# Patient Record
Sex: Male | Born: 1950 | Race: White | Hispanic: No | Marital: Married | Smoking: Former smoker
Health system: Southern US, Community
[De-identification: ages and names within clinical notes are randomized; demographics above are authoritative.]

## PROBLEM LIST (undated history)

## (undated) DIAGNOSIS — L03119 Cellulitis of unspecified part of limb: Secondary | ICD-10-CM

## (undated) DIAGNOSIS — R011 Cardiac murmur, unspecified: Secondary | ICD-10-CM

## (undated) DIAGNOSIS — J189 Pneumonia, unspecified organism: Secondary | ICD-10-CM

## (undated) DIAGNOSIS — L02619 Cutaneous abscess of unspecified foot: Secondary | ICD-10-CM

## (undated) DIAGNOSIS — I739 Peripheral vascular disease, unspecified: Secondary | ICD-10-CM

## (undated) HISTORY — DX: Cutaneous abscess of unspecified foot: L03.119

## (undated) HISTORY — DX: Cellulitis of unspecified part of limb: L02.619

## (undated) HISTORY — PX: ABDOMINAL SURGERY: SHX537

---

## 1973-09-20 HISTORY — PX: HERNIA REPAIR: SHX51

## 1995-09-21 HISTORY — PX: SPINE SURGERY: SHX786

## 1998-01-14 ENCOUNTER — Emergency Department (HOSPITAL_COMMUNITY): Admission: EM | Admit: 1998-01-14 | Discharge: 1998-01-14 | Payer: Self-pay | Admitting: Emergency Medicine

## 2002-02-22 ENCOUNTER — Encounter: Payer: Self-pay | Admitting: Family Medicine

## 2002-02-22 ENCOUNTER — Encounter: Admission: RE | Admit: 2002-02-22 | Discharge: 2002-02-22 | Payer: Self-pay | Admitting: Family Medicine

## 2002-04-13 ENCOUNTER — Encounter: Payer: Self-pay | Admitting: Neurosurgery

## 2002-04-19 ENCOUNTER — Ambulatory Visit (HOSPITAL_COMMUNITY): Admission: RE | Admit: 2002-04-19 | Discharge: 2002-04-19 | Payer: Self-pay | Admitting: Neurosurgery

## 2002-04-19 ENCOUNTER — Encounter: Payer: Self-pay | Admitting: Neurosurgery

## 2002-05-16 ENCOUNTER — Encounter: Payer: Self-pay | Admitting: Neurosurgery

## 2002-05-16 ENCOUNTER — Encounter: Admission: RE | Admit: 2002-05-16 | Discharge: 2002-05-16 | Payer: Self-pay | Admitting: Neurosurgery

## 2004-08-27 ENCOUNTER — Ambulatory Visit (HOSPITAL_COMMUNITY): Admission: RE | Admit: 2004-08-27 | Discharge: 2004-08-27 | Payer: Self-pay | Admitting: Gastroenterology

## 2004-08-27 ENCOUNTER — Encounter (INDEPENDENT_AMBULATORY_CARE_PROVIDER_SITE_OTHER): Payer: Self-pay | Admitting: *Deleted

## 2005-09-20 HISTORY — PX: CERVICAL SPINE SURGERY: SHX589

## 2007-12-22 ENCOUNTER — Inpatient Hospital Stay (HOSPITAL_COMMUNITY): Admission: EM | Admit: 2007-12-22 | Discharge: 2007-12-27 | Payer: Self-pay | Admitting: Emergency Medicine

## 2011-02-02 NOTE — Op Note (Signed)
Joshua Walter, Joshua Walter               ACCOUNT NO.:  [id number]   MEDICAL RECORD NO.:  [id number]          PATIENT TYPE:  INP   LOCATION:  5120                         FACILITY:  [facility]   PHYSICIAN:  Alfonse Ras, MD   DATE OF BIRTH:  1951/01/29   DATE OF PROCEDURE:  12/22/2007  DATE OF DISCHARGE:                               OPERATIVE REPORT   PREOPERATIVE DIAGNOSIS:  Small bowel obstruction.   POSTOPERATIVE DIAGNOSES:  Small bowel obstruction, omental adhesions  with closed loop obstruction.   PROCEDURE:  Exploratory laparotomy, lysis of adhesions.   SURGEON:  Alfonse Ras, MD   ASSISTANT:  Gabrielle Dare. [name], M.D.   ANESTHESIA:  General anesthesia endotracheal tube.   FINDINGS:  The patient had a closed loop obstruction in the mid jejunum  secondary to omental adhesions to the left lower quadrant.  There was no  evidence of diverticulitis.   DESCRIPTION:  The patient was taken to the operating room, placed in a  supine position.  After adequate general anesthesia was induced using  endotracheal tube, the abdomen was prepped and draped in normal sterile  fashion.  Using a vertical midline incision, I dissected down to the  fascia.  Fascia was opened vertically.  On entering the peritoneum, a  small amount of ascites was encountered.  The omentum which was quite  stuck to the left lower quadrant was mobilized using sharp dissection.  There was just a small  band there.  There was no evidence of  diverticulitis.  An area of very erythematous small bowel was identified  which was completely viable.  At this point, the small bowel was run  from the ligament of Treitz to the ileocecal valve.  There was  approximately a 30-cm section of inflamed, erythematous, edematous small  bowel consistent with a closed loop obstruction.  On careful palpation,  there was no evidence of tumor or other pathology.  Small reactive lymph  nodes were identified in the mesentery but nothing  suspicious for  neoplasm.  The colon from the cecum down to the rectum was also run and  other than some very small scattered diverticula, no evidence of  diverticulitis or tumor were noted.  The abdomen was irrigated and the  fascia was closed with a running #1 Novofil.  Skin was closed with  staples.  The patient tolerated the procedure well, went to PACU in good  condition.      Alfonse Ras, MD  Electronically Signed     KRE/MEDQ  D:  12/22/2007  T:  12/22/2007  Job:  [id number]

## 2011-02-02 NOTE — Consult Note (Signed)
Joshua Walter, Joshua Walter               ACCOUNT NO.:  [id number]   MEDICAL RECORD NO.:  [id number]          PATIENT TYPE:  EMS   LOCATION:  MAJO                         FACILITY:  [facility]   PHYSICIAN:  Clovis Pu. Cornett, M.D.DATE OF BIRTH:  [birth year]   DATE OF CONSULTATION:  12/22/2007  DATE OF DISCHARGE:                                 CONSULTATION   REQUESTING PHYSICIAN:  Pollyann Savoy, M.D.   REASON FOR CONSULTATION:  Abdominal pain.   HISTORY OF PRESENT ILLNESS:  Patient is a pleasant 60 year old male with  about a 12-hour history of periumbilical and diffuse abdominal pain.  He  noticed it today while taking a bowel movement.  The pain is sharp in  nature, diffuse, originating in the periumbilical region.  It is  constant in nature and sharp.  It worsened as the day went on, and that  is why he came to the emergency room for evaluation.  He has had no  associated nausea or vomiting currently.  His last bowel movement was  today earlier.  He denies any history of blood in his stool but had a  history of diarrhea last week.  He does have a history of colitis and  peptic ulcer disease.  I was asked to see him today at the request of  Dr. Bernette Mayers in consultation for the above.  The pain has lasted for the  last 12 hours or so.  It has a worsening course.   PAST MEDICAL HISTORY:  1. Peptic ulcer disease.  2. Colitis.   PAST SURGICAL HISTORY:  Left inguinal hernia repair.   FAMILY HISTORY:  Positive for questionable Crohn's disease and Hodgkin's  lymphoma.   SOCIAL HISTORY:  Denies tobacco or alcohol use.  He is married.   ALLERGIES:  No known drug allergies.   MEDICATIONS:  None currently.   REVIEW OF SYSTEMS:  A 15-point review of systems is negative other than  that stated above.   PHYSICAL EXAMINATION:  Temperature 98, pulse 89, blood pressure 153/90.  GENERAL APPEARANCE:  Pleasant male in no apparent distress.  HEENT:  Extraocular movements are intact.  Oropharynx  clear.  NECK:  Supple and nontender.  Trachea is midline.  CHEST:  Clear to auscultation.  Chest wall motion is normal.  CARDIOVASCULAR:  Regular rate and rhythm without murmur, rub or gallop.  Extremities are warm and well perfused.  ABDOMEN:  Distended with decreased bowel sounds.  It is tympanitic.  He  is mildly tender throughout without acute peritoneal signs currently.  EXTREMITIES:  Muscle tone is normal.  Range of motion normal.  NEUROLOGIC:  Glasgow Coma Scale is 15.  Motor and sensory function  grossly intact.   Diagnostic studies are reviewed of his abdomen and pelvic CT.  There  appears to be a small bowel obstruction.  There is a mesenteric pedicle,  but I do not see any swirling of vessels to indicate a torsion.  However, the radiologist does relate that as a possibility in any  report.  He has a minimal amount of ascites.  In the bowel, he does have  some inflamed-appearing small bowel.  No abscess or free air noted.   He has a white count of 20,800.  Hemoglobin 15.  Electrolytes are within  normal limits.  Urinalysis within normal limits.  Amylase and lipase are  normal.   IMPRESSION/PLAN:  1. Small bowel obstruction with unknown etiology with small bowel      swelling and inflammation.  He will be admitted for NG tube      placement, IV fluids resuscitation, antibiotics, and reassessment      in the morning.  We will recheck films in the morning to see if      contrast passes.  He may require laparoscopy to further evaluate      his abdominal complaints, but he has no acute surgical signs      currently and has no signs of peritonitis at this point.  I have      discussed with the patient, and he voices understanding and agrees      to proceed.  2. History of peptic ulcer disease.  3. History of colitis.  4. Questionable history of inflammatory bowel disease.      Thomas A. Cornett, M.D.  Electronically Signed     TAC/MEDQ  D:  12/22/2007  T:  12/22/2007   Job:  [id number]   cc:   Pollyann Savoy, MD

## 2011-02-05 NOTE — Op Note (Signed)
Joshua Walter, Joshua Walter               ACCOUNT NO.:  [id number]   MEDICAL RECORD NO.:  [id number]          PATIENT TYPE:  AMB   LOCATION:  ENDO                         FACILITY:  [facility]   PHYSICIAN:  Graylin Shiver, M.D.   DATE OF BIRTH:  01/22/1951   DATE OF PROCEDURE:  08/27/2004  DATE OF DISCHARGE:                                 OPERATIVE REPORT   PROCEDURE:  Esophagogastroduodenoscopy.   INDICATIONS FOR PROCEDURE:  Recent melena.  Informed consent was obtained  after explanation of the risks of bleeding, infection, and perforation.   PREMEDICATION:  Fentanyl 50 mcg IV, Versed 5 mg IV.   PROCEDURE IN DETAIL:  With the patient in the left lateral decubitus  position, the Olympus gastroscope was inserted into the oropharynx and  passed into the esophagus.  It was advanced down the esophagus, into the  stomach, and into the duodenum.  The second portion and bulb of the duodenum  looked normal.  The stomach revealed a diffuse gastritis and in the  prepyloric antrum, there was a 7 mm antral ulcer.  The rim of the ulcer was  biopsied, no visible vessel was seen.  The fundus and cardia of the stomach  looked unremarkable.  The esophagus looked normal.  He tolerated the  procedure well without complications.   IMPRESSION:  1.  Antral ulcer.  2.  Gastritis.   PLAN:  The biopsies will be checked.  The patient will be given a  prescription for AcipHex 20 mg daily.       SFG/MEDQ  D:  08/27/2004  T:  08/27/2004  Job:  [id number]   cc:   Molly Maduro L. Foy [place], M.D.  [address] [place]  [place] [address]  Fax: [phone]

## 2011-02-05 NOTE — Discharge Summary (Signed)
NAMENERI, SAMEK NO.:  [id number]   MEDICAL RECORD NO.:  [id number]          PATIENT TYPE:  INP   LOCATION:  5120                         FACILITY:  [facility]   PHYSICIAN:  Letha Cape, PA    DATE OF BIRTH:  Dec 22, 1950   DATE OF ADMISSION:  12/21/2007  DATE OF DISCHARGE:  12/27/2007                               DISCHARGE SUMMARY   ADMITTING PHYSICIAN:  Marjory Lies, MD.   DISCHARGING PHYSICIAN:  Velora Heckler, MD.   OPERATING SURGEON:  Alfonse Ras, MD.   DATE OF SURGERY:  December 22, 2007.   PROCEDURE:  Exploratory laparotomy with lysis of adhesions.   CONSULTATION:  There were no consults.   REASON FOR ADMISSION:  This is a 60 year old white male with about a 12-  hour history of periumbilical and diffuse abdominal pain.  He noticed  that today while taking a bowel movement.  The pain is described as  sharp in nature and diffuse and originating in the periumbilical area.  He states that it is also constant.  He says that it has worsened as the  day has progressed and came into the ER for evaluation.  He has had no  associated nausea or vomiting.  His last bowel movement was earlier  today.  We were asked to see him at the request of Dr. Bernette Mayers for  possible small bowel obstruction.   PHYSICAL EXAM:  The patient's abdomen was distended with decreased bowel  sounds.  It was tympanitic and mildly tender throughout without any  acute peritoneal signs.  His white count also at this time is 20,800.  His electrolytes are all within normal limits.  He did have a CT of his  abdomen and pelvis, which showed a small bowel obstruction.  There was  also mesenteric pedicle, but no swirling of vessels was seen to indicate  a torsion.  Therefore, at that time, the patient was admitted for NG-  tube placement, IV fluids, antibiotics, and reassessment in the morning  to see if the patient was getting better.  Otherwise, the patient may  need surgical  intervention to fix his small bowel obstruction.   ADMITTING DIAGNOSES:  1. Small bowel obstruction with unknown etiology.  2. History of peptic ulcer disease.  3. History of colitis.  4. Questionable history of inflammatory bowel disease.   HOSPITAL COURSE:  Later on the date of admission of December 22, 2007, the  patient was continuing to have pain and was not any better with his NG-  tube.  At this time on exam, the patient's abdomen was very tender  throughout with positive guarding and it was felt that the patient was  developing an acute abdomen.  At that time, the patient was taken to the  OR for exploratory laparotomy.  While in the OR, exploratory laparotomy  with lysis of adhesions was performed.  On postoperative day #1, the  patient was doing well.  He had minimal bowel sounds, but his pain was  well controlled and his white count had decreased to 13,800.  By  postoperative day #  2, the patient was complaining of chest pain before.  He states that his PCA was controlling his pain well at this time.  He  still was not having any flatus.  At this time, his activity was  increased.  He was continued with his NG-tube.  By postoperative day #3,  the patient was continuing to do well.  He has started passing flatus  and he has been up and ambulating around.  At this time, the exam of his  abdomen was soft with some mild tenderness and active bowel sounds.  His  incision looked well with no erythema or drainage and his staples were  still intact.  At this time, his NG-tube was clamped and was then  removed at 8:00 a.m. on December 26, 2007, which was postoperative day #4  and clears were started.  By postoperative day #4, the patient was  continuing to improve.  He was tolerating clears at this point.  His NG-  tube was discontinued and the patient's diet was advanced as well as his  PCA was discontinued and he was started on p.o. Vicodin.  By  postoperative day #5, the patient was  tolerating a regular diet as well  as p.o. pain medicines.  At this time, the patient was ready to go home  and stable from our standpoint and therefore discharged home.   DISCHARGE DIAGNOSES:  1. Small bowel obstruction.  2. Status post exploratory laparotomy with lysis of adhesions.  3. History of peptic ulcer disease.  4. History of colitis.  5. Questionable history of inflammatory bowel disease.   DISCHARGE MEDICATIONS:  The patient was not on any home medications and  was given a prescription for Vicodin 5/325 1-2 tablets every 4 hours as  needed for pain.   DISCHARGE INSTRUCTIONS:  The patient was told that he had no diet  restrictions.  He was to increase his activity slowly and he may walk up  steps.  He was also informed that he may shower, however, he is not to  lift anything for approximately 2 weeks greater than 15 pounds.  He is  also told not to drive for at least 1 week.  He is also informed to call  our office if he gets a fever greater than 101.5 or any redness or pus-  like drainage from his incisions.  He is also informed to follow up with  Dr. Everardo All at the [facility] on the following Tuesday for staple  removal.  After that, he is to follow up with Dr. Colin Benton for any other  postop appointment.      Letha Cape, PA     KEO/MEDQ  D:  01/19/2008  T:  01/20/2008  Job:  [id number]   cc:   Alfonse Ras, MD  Doris Cheadle Foy [place], M.D.

## 2011-02-05 NOTE — Op Note (Signed)
NAME:  Joshua Walter, Joshua Walter                         ACCOUNT NO.:  [id number]   MEDICAL RECORD NO.:  [id number]                   PATIENT TYPE:  OIB   LOCATION:  3172                                 FACILITY:  [facility]   PHYSICIAN:  Reinaldo Meeker, M.D.              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   PROCEDURE:  C6-7 anterior cervical diskectomy with fibular bone bank fusion  followed by Zeffer anterior plating with the operating microscope.   SURGEON:  Reinaldo Meeker, M.D.   ASSISTANT:  Kathaleen Maser. Pool, M.D.   DESCRIPTION OF PROCEDURE:  After being placed in the supine position in 5  pounds halter traction, the patient's neck was prepped and draped in the  usual sterile fashion. A localizing x-ray was taken prior to the incision to  identify the appropriate level. A transverse incision was made in the right  anterior neck starting at the midline and heading towards the medial aspect  to the sternocleidomastoid muscle. The platysma muscle was then incised  transversely. The natural fascial plane including the strap muscles medially  and the sternocleidomastoid laterally was identified and followed down to  the anterior aspect of the cervical spine. The longus colli muscles were  identified and split in the midline, stripped away bilaterally with the  Science writer. A self retaining retractor was placed for  exposure. Next, we showed approach to the C6-7 level. Using the 15 blade,  the annulus of the disk was incised. Using pituitary rongeurs and curettes,  approximately 90% of the disk material was removed. The microscope was then  draped and brought onto the field and used for the remainder of the case.  Using microdissection technique, the remainder of the disk  material down to  the posterior longitudinal ligament was removed. The ligament was then  incised transversely and the cut edges removed with the Kerrison punch.  Inspection of the left C6-7 foramen revealed a large amount of herniated  disk material and this was removed which gave excellent decompression and  visualization of the underlying C7 nerve root. Similar decompression was  carried out towards the right, asymptomatic side. At this point, inspection  was carried out in all directions for any evidence of residual compression  and none could be identified. Large amounts of irrigation were carried out  and any bleeding controlled with bipolar coagulation and Gelfoam.  Measurements were taken and an 8 x 11 x 14 mm bone bank plug was  reconstituted. After irrigating once more to confirm hemostasis, the plug  was then packed without difficulty. Fluoroscopy showed the plug to be in  good position. The anterior cervical plate was then chosen. Under  fluoroscopic guidance, 13  mm screws x 4 were placed followed by engaging of  the locking device. Final fluoroscopy showed good placement of the plate,  screws and plug. At this point, large amounts of irrigation were carried out  and any bleeding was controlled with bipolar coagulation. The wound was then  closed using interrupted  Vicryl in the platysma muscle and inverted 5-0 PDS in the subcuticular layer  and Steri-Strips on the skin. A sterile dressing and soft collar were then  applied and the patient was extubated and taken to the recovery room in  stable condition.                                                Reinaldo Meeker, M.D.    ROK/MEDQ  D:  04/19/2002  T:  04/24/2002  Job:  [id number]

## 2011-02-05 NOTE — Op Note (Signed)
Joshua Walter, Joshua Walter               ACCOUNT NO.:  [id number]   MEDICAL RECORD NO.:  [id number]          PATIENT TYPE:  AMB   LOCATION:  ENDO                         FACILITY:  [facility]   PHYSICIAN:  Graylin Shiver, M.D.   DATE OF BIRTH:  Oct 25, 1950   DATE OF PROCEDURE:  08/27/2004  DATE OF DISCHARGE:                                 OPERATIVE REPORT   PROCEDURES:  1.  Colonoscopy.  2.  Polypectomy.   INDICATIONS:  Screening.   Informed consent was obtained after explanation of the risks of bleeding,  infection and perforation.   PREMEDICATION:  The procedure was done immediately after an EGD with an  additional 25 mcg of fentanyl and 2.5 mg of Versed given.   DESCRIPTION OF PROCEDURE:  With the patient in the left lateral decubitus  position, a rectal exam was performed.  No masses were felt.  The Olympus  colonoscope was inserted into the rectum and advanced around the colon to  the cecum.  Cecal landmarks were identified.  The cecum and ascending colon  were normal.  The transverse colon was normal.  The descending colon and  sigmoid revealed a few scattered diverticula.  In the sigmoid, there was a  small sessile polyp which was snared with a mini snare and removed by snare  cautery technique.  The polyp was not retrieved as the polypoid tissue  seemed to be burned in the process of removal.  In the rectum, there were  two 5 mm polyps removed by snare cautery technique.  Cautery sites looked  good.  The rectal polyps were retrieved.  He tolerated the procedure well  without complications.   IMPRESSION:  1.  Colon polyps.  2.  Diverticulosis.   PLAN:  The pathology will be checked.      SFG/MEDQ  D:  08/27/2004  T:  08/27/2004  Job:  [id number]   cc:   Molly Maduro L. Foy [place], M.D.  [address] [place]  [place] [address]  Fax: [phone]

## 2011-06-15 LAB — CBC
HCT: 33.5 — ABNORMAL LOW
HCT: 36.1 — ABNORMAL LOW
HCT: 43.3
HCT: 44.9
Hemoglobin: 11.3 — ABNORMAL LOW
Hemoglobin: 12.5 — ABNORMAL LOW
Hemoglobin: 14.8
Hemoglobin: 15.3
MCHC: 33.9
MCHC: 33.9
MCHC: 34
MCHC: 34.1
MCHC: 34.5
MCV: 95
MCV: 95
MCV: 95.3
MCV: 95.4
Platelets: 194
Platelets: 213
RBC: 3.81 — ABNORMAL LOW
RBC: 3.88 — ABNORMAL LOW
RDW: 13
RDW: 13.4
RDW: 13.6
WBC: 13.8 — ABNORMAL HIGH

## 2011-06-15 LAB — URINALYSIS, ROUTINE W REFLEX MICROSCOPIC
Glucose, UA: NEGATIVE
Leukocytes, UA: NEGATIVE
Nitrite: NEGATIVE
Protein, ur: 30 — AB

## 2011-06-15 LAB — COMPREHENSIVE METABOLIC PANEL
Alkaline Phosphatase: 61
BUN: 4 — ABNORMAL LOW
BUN: 5 — ABNORMAL LOW
Calcium: 9.5
Calcium: 9.7
Creatinine, Ser: 0.66
GFR calc non Af Amer: >60
Glucose, Bld: 113 — ABNORMAL HIGH
Glucose, Bld: 116 — ABNORMAL HIGH
Total Protein: 6.8
Total Protein: 7.4

## 2011-06-15 LAB — BASIC METABOLIC PANEL
CO2: 28
Chloride: 103
GFR calc Af Amer: >60
Sodium: 138

## 2011-06-15 LAB — DIFFERENTIAL
Basophils Relative: 0
Lymphs Abs: 2
Monocytes Relative: 3
Neutro Abs: 18.3 — ABNORMAL HIGH
Neutrophils Relative %: 88 — ABNORMAL HIGH

## 2011-06-15 LAB — URINE MICROSCOPIC-ADD ON

## 2011-06-15 LAB — POTASSIUM: Potassium: 3.6

## 2012-10-06 ENCOUNTER — Other Ambulatory Visit: Payer: Self-pay | Admitting: Podiatry

## 2012-10-27 ENCOUNTER — Other Ambulatory Visit (HOSPITAL_COMMUNITY): Payer: Self-pay | Admitting: Family Medicine

## 2012-10-27 ENCOUNTER — Encounter: Payer: Self-pay | Admitting: Surgery

## 2012-10-27 ENCOUNTER — Other Ambulatory Visit: Payer: Self-pay | Admitting: *Deleted

## 2012-10-27 ENCOUNTER — Ambulatory Visit (HOSPITAL_COMMUNITY)
Admission: RE | Admit: 2012-10-27 | Discharge: 2012-10-27 | Disposition: A | Discharge status: Home / Self Care | Payer: Medicare HMO | Source: Ambulatory Visit | Attending: Family Medicine | Admitting: Family Medicine

## 2012-10-27 DIAGNOSIS — I739 Peripheral vascular disease, unspecified: Secondary | ICD-10-CM

## 2012-10-27 DIAGNOSIS — B999 Unspecified infectious disease: Secondary | ICD-10-CM

## 2012-10-27 DIAGNOSIS — L97409 Non-pressure chronic ulcer of unspecified heel and midfoot with unspecified severity: Secondary | ICD-10-CM

## 2012-10-27 DIAGNOSIS — M869 Osteomyelitis, unspecified: Secondary | ICD-10-CM | POA: Insufficient documentation

## 2012-10-27 MED ORDER — VANCOMYCIN HCL IN DEXTROSE 1-5 GM/200ML-% IV SOLN: 1000.0000 mg | Freq: Once | INTRAVENOUS | Status: DC

## 2012-10-27 MED ORDER — VANCOMYCIN HCL IN DEXTROSE 1-5 GM/200ML-% IV SOLN
INTRAVENOUS | Status: AC
  Administered 2012-10-27: 1 g via INTRAVENOUS
  Filled 2012-10-27: qty 200

## 2012-10-27 MED ORDER — HEPARIN SOD (PORK) LOCK FLUSH 100 UNIT/ML IV SOLN
INTRAVENOUS | Status: AC
  Filled 2012-10-27: qty 5

## 2012-10-27 NOTE — Procedures (Signed)
Successful placement of right basilic vein approach single lumen PICC line with tip at the superior caval-atrial junction.  The PICC line is ready for immediate use. 

## 2012-10-30 ENCOUNTER — Encounter: Payer: Self-pay | Admitting: Surgery

## 2012-10-30 ENCOUNTER — Ambulatory Visit (INDEPENDENT_AMBULATORY_CARE_PROVIDER_SITE_OTHER): Payer: Medicare HMO | Admitting: Surgery

## 2012-10-30 ENCOUNTER — Encounter (INDEPENDENT_AMBULATORY_CARE_PROVIDER_SITE_OTHER): Payer: Medicare HMO | Admitting: Vascular Surgery

## 2012-10-30 ENCOUNTER — Encounter (HOSPITAL_COMMUNITY): Payer: Self-pay | Admitting: Pharmacy Technician

## 2012-10-30 VITALS — BP 159/91 | HR 83 | Ht 70.0 in | Wt 177.0 lb

## 2012-10-30 DIAGNOSIS — L97409 Non-pressure chronic ulcer of unspecified heel and midfoot with unspecified severity: Secondary | ICD-10-CM

## 2012-10-30 DIAGNOSIS — I739 Peripheral vascular disease, unspecified: Secondary | ICD-10-CM

## 2012-10-30 DIAGNOSIS — I7025 Atherosclerosis of native arteries of other extremities with ulceration: Secondary | ICD-10-CM | POA: Insufficient documentation

## 2012-10-30 MED FILL — Heparin Sodium (Porcine) Lock Flush IV Soln 100 Unit/ML: INTRAVENOUS | Qty: 5 | Status: AC

## 2012-10-30 NOTE — Progress Notes (Signed)
[facility] [place]   Patient name: Joshua Walter MRN: [id number] DOB: 02/26/1951 Sex: male   Referred by: Dr. Petery  Reason for referral:  Chief Complaint  Patient presents with  . New Evaluation    PAD, foreign body foot I&D R heel w/ painful wound and ischemic changes - Dr. Andrew Petery     HISTORY OF PRESENT ILLNESS: This is a 62-year-old gentleman who is referred for evaluation of a right heel ulcer. The patient states that he had a thorn go into his heel back in June of 2013. He used tweezers to try to get it out. He subsequently developed an ulcer. This has been debrided as well as explored. Multiple imaging studies showed no evidence of a foreign body. He was recently found to have osteomyelitis and was started on IV antibiotics. His IV antibiotics started 3 days ago. He reports no fevers or chills.  The patient states that he has diet-controlled hypertension. He has a history of smoking but quit in 2005. He also suffers from degenerative joint disease. He states that he has pain in his right foot at night which is alleviated by hanging his foot over the bed. He performs daily dressing changes.  Past Medical History  Diagnosis Date  . Cellulitis and abscess of foot, except toes     Past Surgical History  Procedure Laterality Date  . Spine surgery  1997    Lumbar spine  . Abdominal surgery      2009    History   Social History  . Marital Status: Married    Spouse Name: N/A    Number of Children: N/A  . Years of Education: N/A   Occupational History  . Not on file.   Social History Main Topics  . Smoking status: Former Smoker    Types: Cigarettes    Quit date: 09/20/2004  . Smokeless tobacco: Not on file  . Alcohol Use: Yes  . Drug Use: Not on file  . Sexually Active: Not on file   Other Topics Concern  . Not on file   Social History Narrative  . No narrative on file    Family History  Problem Relation Age of Onset  . Cancer  Mother     Allergies as of 10/30/2012  . (No Known Allergies)    Current Outpatient Prescriptions on File Prior to Visit  Medication Sig Dispense Refill  . HYDROcodone-acetaminophen (NORCO/VICODIN) 5-325 MG per tablet Take 1 tablet by mouth every 6 (six) hours as needed.      . sulfamethoxazole-trimethoprim (BACTRIM,SEPTRA) 400-80 MG per tablet Take 1 tablet by mouth 2 (two) times daily.       No current facility-administered medications on file prior to visit.     REVIEW OF SYSTEMS: Positive for pain in his feet when lying flat and with walking  PHYSICAL EXAMINATION: General: The patient appears their stated age.  Vital signs are BP 159/91  Pulse 83  Ht 5' 10" (1.778 m)  Wt 177 lb (80.287 kg)  BMI 25.4 kg/m2  SpO2 100% HEENT:  No gross abnormalities Pulmonary: Respirations are non-labored Abdomen: Soft and non-tender midline abdominal incision with an incisional hernia Musculoskeletal: There are no major deformities.   Neurologic: No focal weakness or paresthesias are detected, Skin: There are no ulcer or rashes noted. 1.5 x 1.5 ulcer on the heel. Psychiatric: The patient has normal affect. Cardiovascular: There is a regular rate and rhythm without significant murmur appreciated. Palpable femoral pulses. Pedal   and popliteal pulses are not palpable. No carotid bruits.  Diagnostic Studies: ABIs were performed today. 0.37 on the right and 0.84 on the left. Toe pressure on the right was 24. On the left toe pressure was 68.  Outside Studies/Documentation Historical records were reviewed.  They showed chronic right heel ulcer in the setting of peripheral vascular disease  Medication Changes: None  Assessment:  Peripheral vascular disease with ulceration, right heel Plan: I discussed with the patient that this is a very complicated situation which could potentially lead to limb loss. With his ultrasound findings today, the first order of business is to proceed with  angiography and an attempt to to improve blood flow to his right leg. This could be done potentially percutaneously at the time of his angiogram which has been scheduled for Thursday, February 19. Alternatively, he could require surgical bypass to improve his blood flow. He will most likely need further debridement of the heel which could potentially include debridement of the calcaneus over the areas of osteomyelitis.     V. Wells Scottie Stanish IV, M.D. [facility] [place] Office: [phone] Pager:  [phone]   

## 2012-11-01 ENCOUNTER — Other Ambulatory Visit: Payer: Self-pay

## 2012-11-04 ENCOUNTER — Other Ambulatory Visit: Payer: Self-pay

## 2012-11-06 MED ORDER — SODIUM CHLORIDE 0.9 % IV SOLN
INTRAVENOUS | Status: DC
  Administered 2012-11-07: 09:00:00 via INTRAVENOUS

## 2012-11-07 ENCOUNTER — Ambulatory Visit (HOSPITAL_COMMUNITY)
Admission: RE | Admit: 2012-11-07 | Discharge: 2012-11-07 | Disposition: A | Discharge status: Home / Self Care | Payer: Medicare HMO | Source: Ambulatory Visit | Attending: Surgery | Admitting: Surgery

## 2012-11-07 ENCOUNTER — Encounter (HOSPITAL_COMMUNITY)
Admission: RE | Disposition: A | Discharge status: Home / Self Care | Payer: Self-pay | Source: Ambulatory Visit | Attending: Surgery

## 2012-11-07 ENCOUNTER — Telehealth: Payer: Self-pay | Admitting: Surgery

## 2012-11-07 ENCOUNTER — Other Ambulatory Visit: Payer: Self-pay | Admitting: *Deleted

## 2012-11-07 DIAGNOSIS — L98499 Non-pressure chronic ulcer of skin of other sites with unspecified severity: Secondary | ICD-10-CM | POA: Insufficient documentation

## 2012-11-07 DIAGNOSIS — I739 Peripheral vascular disease, unspecified: Secondary | ICD-10-CM

## 2012-11-07 DIAGNOSIS — L97409 Non-pressure chronic ulcer of unspecified heel and midfoot with unspecified severity: Secondary | ICD-10-CM | POA: Insufficient documentation

## 2012-11-07 DIAGNOSIS — I10 Essential (primary) hypertension: Secondary | ICD-10-CM | POA: Insufficient documentation

## 2012-11-07 DIAGNOSIS — Z01810 Encounter for preprocedural cardiovascular examination: Secondary | ICD-10-CM

## 2012-11-07 DIAGNOSIS — M1990 Unspecified osteoarthritis, unspecified site: Secondary | ICD-10-CM | POA: Insufficient documentation

## 2012-11-07 HISTORY — PX: ABDOMINAL AORTAGRAM: SHX5454

## 2012-11-07 LAB — POCT I-STAT, CHEM 8
BUN: 8 mg/dL (ref 6–23)
Creatinine, Ser: 0.9 mg/dL (ref 0.50–1.35)
Hemoglobin: 13.6 g/dL (ref 13.0–17.0)
Potassium: 4.2 mEq/L (ref 3.5–5.1)
Sodium: 140 mEq/L (ref 135–145)

## 2012-11-07 SURGERY — ABDOMINAL AORTAGRAM
Anesthesia: LOCAL

## 2012-11-07 MED ORDER — PHENOL 1.4 % MT LIQD: 1.0000 | OROMUCOSAL | Status: DC | PRN

## 2012-11-07 MED ORDER — ACETAMINOPHEN 325 MG PO TABS: 325.0000 mg | ORAL_TABLET | ORAL | Status: DC | PRN

## 2012-11-07 MED ORDER — MORPHINE SULFATE 10 MG/ML IJ SOLN: 2.0000 mg | INTRAMUSCULAR | Status: DC | PRN

## 2012-11-07 MED ORDER — SODIUM CHLORIDE 0.9 % IV SOLN: 1.0000 mL/kg/h | INTRAVENOUS | Status: DC

## 2012-11-07 MED ORDER — GUAIFENESIN-DM 100-10 MG/5ML PO SYRP: 15.0000 mL | ORAL_SOLUTION | ORAL | Status: DC | PRN

## 2012-11-07 MED ORDER — HYDRALAZINE HCL 20 MG/ML IJ SOLN: 10.0000 mg | INTRAMUSCULAR | Status: DC | PRN

## 2012-11-07 MED ORDER — MIDAZOLAM HCL 2 MG/2ML IJ SOLN
INTRAMUSCULAR | Status: AC
  Filled 2012-11-07: qty 2

## 2012-11-07 MED ORDER — HEPARIN SOD (PORK) LOCK FLUSH 100 UNIT/ML IV SOLN
INTRAVENOUS | Status: AC
  Filled 2012-11-07: qty 5

## 2012-11-07 MED ORDER — LIDOCAINE HCL (PF) 1 % IJ SOLN
INTRAMUSCULAR | Status: AC
  Filled 2012-11-07: qty 60

## 2012-11-07 MED ORDER — ALUM & MAG HYDROXIDE-SIMETH 200-200-20 MG/5ML PO SUSP: 15.0000 mL | ORAL | Status: DC | PRN

## 2012-11-07 MED ORDER — OXYCODONE HCL 5 MG PO TABS: 5.0000 mg | ORAL_TABLET | ORAL | Status: DC | PRN

## 2012-11-07 MED ORDER — HEPARIN (PORCINE) IN NACL 2-0.9 UNIT/ML-% IJ SOLN
INTRAMUSCULAR | Status: AC
  Filled 2012-11-07: qty 500

## 2012-11-07 MED ORDER — FENTANYL CITRATE 0.05 MG/ML IJ SOLN
INTRAMUSCULAR | Status: AC
  Filled 2012-11-07: qty 2

## 2012-11-07 MED ORDER — LABETALOL HCL 5 MG/ML IV SOLN: 10.0000 mg | INTRAVENOUS | Status: DC | PRN

## 2012-11-07 MED ORDER — ACETAMINOPHEN 325 MG RE SUPP: 325.0000 mg | RECTAL | Status: DC | PRN

## 2012-11-07 MED ORDER — METOPROLOL TARTRATE 1 MG/ML IV SOLN: 2.0000 mg | INTRAVENOUS | Status: DC | PRN

## 2012-11-07 MED ORDER — ONDANSETRON HCL 4 MG/2ML IJ SOLN: 4.0000 mg | Freq: Four times a day (QID) | INTRAMUSCULAR | Status: DC | PRN

## 2012-11-07 NOTE — H&P (View-Only) (Signed)
[facility] [place]   Patient name: Joshua Walter MRN: [id number] DOB: [birth year] Sex: male   Referred by: Dr. Elvin So  Reason for referral:  Chief Complaint  Patient presents with  . New Evaluation    PAD, foreign body foot I&D R heel w/ painful wound and ischemic changes - Dr. Karren Burly     HISTORY OF PRESENT ILLNESS: This is a 62 year old gentleman who is referred for evaluation of a right heel ulcer. The patient states that he had a thorn go into his heel back in June of 2013. He used tweezers to try to get it out. He subsequently developed an ulcer. This has been debrided as well as explored. Multiple imaging studies showed no evidence of a foreign body. He was recently found to have osteomyelitis and was started on IV antibiotics. His IV antibiotics started 3 days ago. He reports no fevers or chills.  The patient states that he has diet-controlled hypertension. He has a history of smoking but quit in 2005. He also suffers from degenerative joint disease. He states that he has pain in his right foot at night which is alleviated by hanging his foot over the bed. He performs daily dressing changes.  Past Medical History  Diagnosis Date  . Cellulitis and abscess of foot, except toes     Past Surgical History  Procedure Laterality Date  . Spine surgery  1997    Lumbar spine  . Abdominal surgery      2009    History   Social History  . Marital Status: Married    Spouse Name: N/A    Number of Children: N/A  . Years of Education: N/A   Occupational History  . Not on file.   Social History Main Topics  . Smoking status: Former Smoker    Types: Cigarettes    Quit date: 09/20/2004  . Smokeless tobacco: Not on file  . Alcohol Use: Yes  . Drug Use: Not on file  . Sexually Active: Not on file   Other Topics Concern  . Not on file   Social History Narrative  . No narrative on file    Family History  Problem Relation Age of Onset  . Cancer  Mother     Allergies as of 10/30/2012  . (No Known Allergies)    Current Outpatient Prescriptions on File Prior to Visit  Medication Sig Dispense Refill  . HYDROcodone-acetaminophen (NORCO/VICODIN) 5-325 MG per tablet Take 1 tablet by mouth every 6 (six) hours as needed.      . sulfamethoxazole-trimethoprim (BACTRIM,SEPTRA) 400-80 MG per tablet Take 1 tablet by mouth 2 (two) times daily.       No current facility-administered medications on file prior to visit.     REVIEW OF SYSTEMS: Positive for pain in his feet when lying flat and with walking  PHYSICAL EXAMINATION: General: The patient appears their stated age.  Vital signs are BP 159/91  Pulse 83  Ht 5\' 10"  (1.778 m)  Wt 177 lb (80.287 kg)  BMI 25.4 kg/m2  SpO2 100% HEENT:  No gross abnormalities Pulmonary: Respirations are non-labored Abdomen: Soft and non-tender midline abdominal incision with an incisional hernia Musculoskeletal: There are no major deformities.   Neurologic: No focal weakness or paresthesias are detected, Skin: There are no ulcer or rashes noted. 1.5 x 1.5 ulcer on the heel. Psychiatric: The patient has normal affect. Cardiovascular: There is a regular rate and rhythm without significant murmur appreciated. Palpable femoral pulses. Pedal  and popliteal pulses are not palpable. No carotid bruits.  Diagnostic Studies: ABIs were performed today. 0.37 on the right and 0.84 on the left. Toe pressure on the right was 24. On the left toe pressure was 68.  Outside Studies/Documentation Historical records were reviewed.  They showed chronic right heel ulcer in the setting of peripheral vascular disease  Medication Changes: None  Assessment:  Peripheral vascular disease with ulceration, right heel Plan: I discussed with the patient that this is a very complicated situation which could potentially lead to limb loss. With his ultrasound findings today, the first order of business is to proceed with  angiography and an attempt to to improve blood flow to his right leg. This could be done potentially percutaneously at the time of his angiogram which has been scheduled for Thursday, February 19. Alternatively, he could require surgical bypass to improve his blood flow. He will most likely need further debridement of the heel which could potentially include debridement of the calcaneus over the areas of osteomyelitis.     Jorge Ny, M.D. [facility] [place] Office: [phone] Pager:  [phone]

## 2012-11-07 NOTE — Telephone Encounter (Signed)
Left message for pt to call back for the following information:  Myoview @ [facility] on 11/14/12, arrive at 8:15am, this will be a 3 to 4 hour test, and Deshler will call with further instruction.  Carotid, vein mapping and Dr Myra Gianotti on 11/20/2012 at 11:00am for preoperative purposes.  Waiting on return call from patient, dpm

## 2012-11-07 NOTE — Telephone Encounter (Signed)
Message copied by Fredrich Birks on Tue Nov 07, 2012 11:51 AM ------      Message from: Melene Plan      Created: Tue Nov 07, 2012 10:39 AM                   ----- Message -----         From: Nada Libman, MD         Sent: 11/07/2012  10:33 AM           To: Reuel Derby, Melene Plan, RN            11/07/2012:            Procedure Performed:       1.  ultrasound-guided access, left femoral artery       2.  abdominal aortogram       3.  second order catheterization       4.  bilateral lower extremity runoff            The patient needs to come back to see me in the office in 2 weeks. Prior to coming back, he will need a Myoview. When I see him in 2 weeks, before I would like for him to have bilateral lower extremity vein mapping and a carotid duplex indication is pre-op ------

## 2012-11-07 NOTE — Interval H&P Note (Signed)
History and Physical Interval Note:  11/07/2012 8:52 AM  Joshua Walter  has presented today for surgery, with the diagnosis of PVD  The various methods of treatment have been discussed with the patient and family. After consideration of risks, benefits and other options for treatment, the patient has consented to  Procedure(s): ABDOMINAL AORTAGRAM (N/A) as a surgical intervention .  The patient's history has been reviewed, patient examined, no change in status, stable for surgery.  I have reviewed the patient's chart and labs.  Questions were answered to the patient's satisfaction.     Joshua Walter, V. WELLS

## 2012-11-07 NOTE — Telephone Encounter (Signed)
Patients wife called for instruction, voiced understanding.

## 2012-11-07 NOTE — Op Note (Signed)
[facility] [place]  Patient name: Joshua Walter MRN: [id number] DOB: 1951/06/15 Sex: male  11/07/2012 Pre-operative Diagnosis: Right heel ulcer Post-operative diagnosis:  Same Surgeon:  Jorge Ny Procedure Performed:  1.  ultrasound-guided access, left femoral artery  2.  abdominal aortogram  3.  second order catheterization  4.  bilateral lower extremity runoff   Indications:  The patient has a chronic right heel ulcer. Ultrasound indicated decreased blood flow to the right leg. He comes in today for further evaluation and possible  Procedure:  The patient was identified in the holding area and taken to room 8.  The patient was then placed supine on the table and prepped and draped in the usual sterile fashion.  A time out was called.  Ultrasound was used to evaluate the left common femoral artery.  It was patent .  A digital ultrasound image was acquired.  A micropuncture needle was used to access the left common femoral artery under ultrasound guidance.  An 018 wire was advanced without resistance and a micropuncture sheath was placed.  The 018 wire was removed and a benson wire was placed.  The micropuncture sheath was exchanged for a 5 french sheath.  An omniflush catheter was advanced over the wire to the level of L-1.  An abdominal angiogram was obtained.  Next, using the omniflush catheter and a benson wire, the aortic bifurcation was crossed and the catheter was placed into theright external iliac artery and right runoff was obtained.  left runoff was performed via retrograde sheath injections.  Findings:   Aortogram:  The visualized portion of the suprarenal abdominal aorta showed no significant stenosis. There is no evidence of renal artery stenosis. The infrarenal abdominal aorta is widely patent. The right common and external iliac arteries are widely patent. The left common iliac artery is not fully visualized. There does appear to be a stenosis in  the left external iliac artery.  Right Lower Extremity:  The right common femoral artery is widely patent. The right profunda femoral artery is widely patent. The right superficial femoral artery is occluded at its origin. There is reconstitution of the anterior tibial artery at its origin. This is the dominant runoff across the ankle.  Left Lower Extremity:  The left common femoral artery is widely patent the left profunda femoral artery is widely patent. The left superficial femoral artery is patent, however there is diffuse narrowing with a high-grade stenosis in the adductor canal. The popliteal artery is patent throughout it's course. The anterior tibial artery is the dominant runoff  Intervention:  None  Impression:  #1  occluded right superficial femoral artery with reconstitution of the anterior tibial artery.  #2  focal stenosis within the left superficial femoral artery  #3  the patient is not a candidate for percutaneous revascularization of the right leg. He will be brought back for consideration of a right femoral to anterior tibial artery bypass graft     V. Durene Cal, M.D. [facility] [place] Office: [phone] Pager:  [phone]

## 2012-11-14 ENCOUNTER — Ambulatory Visit (HOSPITAL_COMMUNITY): Payer: Medicare HMO | Attending: Cardiology | Admitting: Radiology

## 2012-11-14 VITALS — BP 132/80 | HR 68 | Ht 70.0 in | Wt 176.0 lb

## 2012-11-14 DIAGNOSIS — R42 Dizziness and giddiness: Secondary | ICD-10-CM | POA: Insufficient documentation

## 2012-11-14 DIAGNOSIS — I739 Peripheral vascular disease, unspecified: Secondary | ICD-10-CM | POA: Insufficient documentation

## 2012-11-14 DIAGNOSIS — Z79899 Other long term (current) drug therapy: Secondary | ICD-10-CM | POA: Insufficient documentation

## 2012-11-14 DIAGNOSIS — I10 Essential (primary) hypertension: Secondary | ICD-10-CM | POA: Insufficient documentation

## 2012-11-14 DIAGNOSIS — Z01810 Encounter for preprocedural cardiovascular examination: Secondary | ICD-10-CM | POA: Insufficient documentation

## 2012-11-14 DIAGNOSIS — R079 Chest pain, unspecified: Secondary | ICD-10-CM | POA: Insufficient documentation

## 2012-11-14 MED ORDER — REGADENOSON 0.4 MG/5ML IV SOLN
0.4000 mg | Freq: Once | INTRAVENOUS | Status: AC
  Administered 2012-11-14: 0.4 mg via INTRAVENOUS

## 2012-11-14 MED ORDER — TECHNETIUM TC 99M SESTAMIBI GENERIC - CARDIOLITE
32.9000 | Freq: Once | INTRAVENOUS | Status: AC | PRN
  Administered 2012-11-14: 32.9 via INTRAVENOUS

## 2012-11-14 MED ORDER — TECHNETIUM TC 99M SESTAMIBI GENERIC - CARDIOLITE
11.0000 | Freq: Once | INTRAVENOUS | Status: AC | PRN
  Administered 2012-11-14: 11 via INTRAVENOUS

## 2012-11-14 NOTE — Progress Notes (Signed)
[facility] 3 NUCLEAR MED [address] [place], [place] [address] [phone]    Cardiology Nuclear Med Study  Joshua Walter is a 62 y.o. male     MRN : [id number]     DOB: 08-25-1951  Procedure Date: 11/14/2012  Nuclear Med Background Indication for Stress Test:  Evaluation for Ischemia and Pending Clearance for (R) Fem-Tib Bypass with Dr. Coral Else History:  '95 GXT:OK per patient Cardiac Risk Factors: History of Smoking, Hypertension and PVD  Symptoms:  No cardiac symptoms.   Nuclear Pre-Procedure Caffeine/Decaff Intake:  None NPO After: 5:30pm   Lungs:  Inspiratory wheezes.  Albuterol inhaler used prior to [employer]. O2 Sat: 97% on room air. IV 0.9% NS with Angio Cath:  22g  IV Site: L Antecubital  IV Started by:  Doyne Keel, CNMT  Chest Size (in):  44 Cup Size: n/a  Height: 5\' 10"  (1.778 m)  Weight:  176 lb (79.833 kg)  BMI:  Body mass index is 25.25 kg/(m^2). Tech Comments:  Held all am Insurance underwriter Med Study 1 or 2 day study: 1 day  Stress Test Type:  Lexiscan  Reading MD: Olga Millers, MD  Order Authorizing Provider:  Juleen China, MD  Resting Radionuclide: Technetium 58m Sestamibi  Resting Radionuclide Dose: 11.0 mCi   Stress Radionuclide:  Technetium 42m Sestamibi  Stress Radionuclide Dose: 32.9 mCi           Stress Protocol Rest HR: 68 Stress HR: 89  Rest BP: 132/80 Stress BP: 140/69  Exercise Time (min): n/a METS: n/a   Predicted Max HR: 160 bpm % Max HR: 55.62 bpm Rate Pressure Product: 21308   Dose of Adenosine (mg):  n/a Dose of Lexiscan: 0.4 mg  Dose of Atropine (mg): n/a Dose of Dobutamine: n/a mcg/kg/min (at max HR)  Stress Test Technologist: Smiley Houseman, CMA-N  Nuclear Technologist:  Domenic Polite, CNMT     Rest Procedure:  Myocardial perfusion imaging was performed at rest 45 minutes following the intravenous administration of Technetium 40m Sestamibi.  Rest ECG: NSR - Normal EKG  Stress Procedure:  The  patient received IV Lexiscan 0.4 mg over 15-seconds.  Technetium 20m Sestamibi injected at 30-seconds.  He c/o chest tightness with Lexiscan.  Quantitative spect images were obtained after a 45 minute delay.  Stress ECG: No significant ST segment change suggestive of ischemia.  QPS Raw Data Images:  Acquisition technically good; normal left ventricular size. Stress Images:  Normal homogeneous uptake in all areas of the myocardium. Rest Images:  Normal homogeneous uptake in all areas of the myocardium. Subtraction (SDS):  No evidence of ischemia. Transient Ischemic Dilatation (Normal <1.22):  1.17 Lung/Heart Ratio (Normal <0.45):  0.37  Quantitative Gated Spect Images QGS EDV:  119 ml QGS ESV:  44 ml  Impression Exercise Capacity:  Lexiscan with no exercise. BP Response:  Normal blood pressure response. Clinical Symptoms:  There is chest tightness. ECG Impression:  No significant ST segment change suggestive of ischemia. Comparison with Prior Nuclear Study: No previous nuclear study performed  Overall Impression:  Normal stress nuclear study.  LV Ejection Fraction: 63%.  LV Wall Motion:  NL LV Function; NL Wall Motion  Olga Millers

## 2012-11-17 ENCOUNTER — Encounter: Payer: Self-pay | Admitting: Surgery

## 2012-11-20 ENCOUNTER — Ambulatory Visit (INDEPENDENT_AMBULATORY_CARE_PROVIDER_SITE_OTHER): Payer: Medicare HMO | Admitting: Surgery

## 2012-11-20 ENCOUNTER — Encounter (INDEPENDENT_AMBULATORY_CARE_PROVIDER_SITE_OTHER): Payer: Medicare HMO | Admitting: *Deleted

## 2012-11-20 ENCOUNTER — Encounter: Payer: Self-pay | Admitting: Surgery

## 2012-11-20 ENCOUNTER — Other Ambulatory Visit (INDEPENDENT_AMBULATORY_CARE_PROVIDER_SITE_OTHER): Payer: Medicare HMO | Admitting: *Deleted

## 2012-11-20 ENCOUNTER — Other Ambulatory Visit: Payer: Self-pay

## 2012-11-20 VITALS — BP 146/83 | HR 78 | Ht 70.0 in | Wt 173.2 lb

## 2012-11-20 DIAGNOSIS — L97419 Non-pressure chronic ulcer of right heel and midfoot with unspecified severity: Secondary | ICD-10-CM

## 2012-11-20 DIAGNOSIS — I739 Peripheral vascular disease, unspecified: Secondary | ICD-10-CM

## 2012-11-20 DIAGNOSIS — Z01810 Encounter for preprocedural cardiovascular examination: Secondary | ICD-10-CM

## 2012-11-20 DIAGNOSIS — I6529 Occlusion and stenosis of unspecified carotid artery: Secondary | ICD-10-CM | POA: Insufficient documentation

## 2012-11-20 NOTE — Progress Notes (Signed)
[facility] [place]   Patient name: Joshua Walter MRN: [id number] DOB: Nov 14, 1950 Sex: male     Chief Complaint  Patient presents with  . Re-evaluation    2 wk f/u abdominal aortogram w/ LE vein mapping and carotid pre op    HISTORY OF PRESENT ILLNESS: The patient is back today for followup. He was referred for evaluation of a right heel ulcer. The patient states that he had a thorn go into his heel back in June of 2013. He used tweezers to try to get it out. He subsequently developed an ulcer. This has been debrided as well as explored. Multiple imaging studies showed no evidence of a foreign body. He was recently found to have osteomyelitis and was started on IV antibiotics. His IV antibiotics started 3 days ago. He reports no fevers or chills.  The patient states that he has diet-controlled hypertension. He has a history of smoking but quit in 2005. He also suffers from degenerative joint disease. He states that he has pain in his right foot at night which is alleviated by hanging his foot over the bed. He performs daily dressing changes.  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   Past Medical History  Diagnosis Date  . Cellulitis and abscess of foot, except toes     Past Surgical History  Procedure Laterality Date  . Spine surgery  1997    Lumbar spine  . Abdominal surgery      2009    History   Social History  . Marital Status: Married    Spouse Name: N/A    Number of Children: N/A  . Years of Education: N/A   Occupational History  . Not on file.   Social History Main Topics  . Smoking status: Former Smoker    Types: Cigarettes    Quit date: 09/20/2004  . Smokeless tobacco: Not on file  . Alcohol Use: Yes  . Drug Use: Not on file  . Sexually Active: Not on file   Other Topics Concern  .  Not on file   Social History Narrative  . No narrative on file    Family History  Problem Relation Age of Onset  . Cancer Mother     Allergies as of 11/20/2012  . (No Known Allergies)    Current Outpatient Prescriptions on File Prior to Visit  Medication Sig Dispense Refill  . Multiple Vitamin (MULTIVITAMIN WITH MINERALS) TABS Take 1 tablet by mouth daily.      . vancomycin (VANCOCIN) 1 GM/200ML SOLN Inject 1,250 mg into the vein 2 (two) times daily. Started on 10/27/12. [facility]. Expected length of treatment is approximately 6-8 weeks.      Marland Kitchen HYDROcodone-acetaminophen (NORCO) 7.5-325 MG per tablet Take 1 tablet by mouth every 4 (four) hours as needed for pain. For pain.       No current facility-administered medications on file prior to visit.     REVIEW OF SYSTEMS: No changes since prior visit  PHYSICAL EXAMINATION:   Vital signs are BP 146/83  Pulse 78  Ht 5\' 10"  (1.778 m)  Wt 173 lb 3.2 oz (78.563 kg)  BMI 24.85 kg/m2  SpO2 97% General: The patient appears their stated age. HEENT:  No gross abnormalities Pulmonary:  Non labored breathing Musculoskeletal: There are no major deformities. Skin: Right heel ulcer is unchanged.Marland Kitchen  Psychiatric: The patient has normal affect. Cardiovascular: There is a regular rate and rhythm without significant murmur appreciated.   Diagnostic Studies Carotid ultrasound shows less than 40% stenosis bilaterally. Vein mapping shows an adequate right greater saphenous vein Myocardial imaging showed no evidence of ischemia.  Assessment: Right heel ulcer Plan: I believe the patient's best chance for healing his wound is going to be a right femoral to anterior tibial artery bypass graft with ipsilateral saphenous vein. I discussed the risks and benefits of the procedure. We discussed the need for long-term surveillance of the bypass as well as possibility of premature bypass graft failure. We also discussed that U. and with adequate  blood flow he could have problems healing the ulcer on his heel. His operation has been scheduled for March 20. It was delayed because of his wife's work schedule. I have made sure that he starts a baby aspirin today.  Jorge Ny, M.D. [facility] [place] Office: [phone] Pager:  [phone]

## 2012-11-29 ENCOUNTER — Ambulatory Visit (HOSPITAL_COMMUNITY)
Admission: RE | Admit: 2012-11-29 | Discharge: 2012-11-29 | Disposition: A | Discharge status: Home / Self Care | Payer: Medicare HMO | Source: Ambulatory Visit | Attending: Anesthesiology | Admitting: Anesthesiology

## 2012-11-29 ENCOUNTER — Encounter (HOSPITAL_COMMUNITY)
Admission: RE | Admit: 2012-11-29 | Discharge: 2012-11-29 | Disposition: A | Discharge status: Home / Self Care | Payer: Medicare HMO | Source: Ambulatory Visit | Attending: Surgery | Admitting: Surgery

## 2012-11-29 ENCOUNTER — Encounter (HOSPITAL_COMMUNITY): Payer: Self-pay

## 2012-11-29 DIAGNOSIS — Z01812 Encounter for preprocedural laboratory examination: Secondary | ICD-10-CM | POA: Insufficient documentation

## 2012-11-29 DIAGNOSIS — Z01818 Encounter for other preprocedural examination: Secondary | ICD-10-CM | POA: Insufficient documentation

## 2012-11-29 HISTORY — DX: Peripheral vascular disease, unspecified: I73.9

## 2012-11-29 HISTORY — DX: Cardiac murmur, unspecified: R01.1

## 2012-11-29 HISTORY — DX: Pneumonia, unspecified organism: J18.9

## 2012-11-29 LAB — SURGICAL PCR SCREEN
MRSA, PCR: NEGATIVE
Staphylococcus aureus: NEGATIVE

## 2012-11-29 LAB — URINE MICROSCOPIC-ADD ON

## 2012-11-29 LAB — CBC
Hemoglobin: 14.9 g/dL (ref 13.0–17.0)
MCH: 31 pg (ref 26.0–34.0)
MCV: 89.4 fL (ref 78.0–100.0)
Platelets: 269 10*3/uL (ref 150–400)
RBC: 4.8 MIL/uL (ref 4.22–5.81)
WBC: 11.8 10*3/uL — ABNORMAL HIGH (ref 4.0–10.5)

## 2012-11-29 LAB — URINALYSIS, ROUTINE W REFLEX MICROSCOPIC
Glucose, UA: NEGATIVE mg/dL
Hgb urine dipstick: NEGATIVE
Specific Gravity, Urine: 1.023 (ref 1.005–1.030)
pH: 5.5 (ref 5.0–8.0)

## 2012-11-29 LAB — COMPREHENSIVE METABOLIC PANEL
ALT: 21 U/L (ref 0–53)
AST: 17 U/L (ref 0–37)
CO2: 25 mEq/L (ref 19–32)
Chloride: 102 mEq/L (ref 96–112)
GFR calc Af Amer: >90 mL/min (ref >90)
GFR calc non Af Amer: >90 mL/min (ref >90)
Glucose, Bld: 97 mg/dL (ref 70–99)
Sodium: 140 mEq/L (ref 135–145)
Total Bilirubin: 0.5 mg/dL (ref 0.3–1.2)

## 2012-11-29 NOTE — Pre-Procedure Instructions (Addendum)
CASHAWN YANKO  11/29/2012   Your procedure is scheduled on:  12/08/12  Report to [facility] at 530 AM.  Call this number if you have problems the morning of surgery: [phone]   Remember:   Do not eat food or drink liquids after midnight.   Take these medicines the morning of surgery with A SIP OF WATER: dilaudid   Do not wear jewelry, make-up or nail polish.  Do not wear lotions, powders, or perfumes. You may wear deodorant.  Do not shave 48 hours prior to surgery. Men may shave face and neck.  Do not bring valuables to the hospital.  Contacts, dentures or bridgework may not be worn into surgery.  Leave suitcase in the car. After surgery it may be brought to your room.  For patients admitted to the hospital, checkout time is 11:00 AM the day of  discharge.   Patients discharged the day of surgery will not be allowed to drive  home.  Name and phone number of your driver:   Special Instructions: Shower using CHG 2 nights before surgery and the night before surgery.  If you shower the day of surgery use CHG.  Use special wash - you have one bottle of CHG for all showers.  You should use approximately 1/3 of the bottle for each shower.   Please read over the following fact sheets that you were given: Pain Booklet, Coughing and Deep Breathing, Blood Transfusion Information, MRSA Information and Surgical Site Infection Prevention

## 2012-12-07 MED ORDER — SODIUM CHLORIDE 0.9 % IV SOLN: INTRAVENOUS | Status: DC

## 2012-12-07 MED ORDER — DEXTROSE 5 % IV SOLN
1.5000 g | INTRAVENOUS | Status: AC
  Administered 2012-12-08: 1.5 g via INTRAVENOUS
  Filled 2012-12-07 (×2): qty 1.5

## 2012-12-07 NOTE — Progress Notes (Signed)
Pt notified of time change;to arrive at 0730

## 2012-12-08 ENCOUNTER — Inpatient Hospital Stay (HOSPITAL_COMMUNITY)
Admission: RE | Admit: 2012-12-08 | Discharge: 2012-12-11 | DRG: 253 | Disposition: A | Discharge status: Home Health | Payer: Medicare HMO | Source: Ambulatory Visit | Attending: Surgery | Admitting: Surgery

## 2012-12-08 ENCOUNTER — Inpatient Hospital Stay (HOSPITAL_COMMUNITY): Payer: Medicare HMO | Admitting: Anesthesiology

## 2012-12-08 ENCOUNTER — Encounter (HOSPITAL_COMMUNITY): Payer: Self-pay | Admitting: *Deleted

## 2012-12-08 ENCOUNTER — Encounter (HOSPITAL_COMMUNITY): Payer: Self-pay | Admitting: Anesthesiology

## 2012-12-08 ENCOUNTER — Encounter (HOSPITAL_COMMUNITY)
Admission: RE | Disposition: A | Discharge status: Home Health | Payer: Self-pay | Source: Ambulatory Visit | Attending: Surgery

## 2012-12-08 DIAGNOSIS — L98499 Non-pressure chronic ulcer of skin of other sites with unspecified severity: Principal | ICD-10-CM | POA: Diagnosis present

## 2012-12-08 DIAGNOSIS — I10 Essential (primary) hypertension: Secondary | ICD-10-CM | POA: Diagnosis present

## 2012-12-08 DIAGNOSIS — M1990 Unspecified osteoarthritis, unspecified site: Secondary | ICD-10-CM | POA: Diagnosis present

## 2012-12-08 DIAGNOSIS — D62 Acute posthemorrhagic anemia: Secondary | ICD-10-CM | POA: Diagnosis not present

## 2012-12-08 DIAGNOSIS — L97409 Non-pressure chronic ulcer of unspecified heel and midfoot with unspecified severity: Secondary | ICD-10-CM | POA: Diagnosis present

## 2012-12-08 DIAGNOSIS — I7092 Chronic total occlusion of artery of the extremities: Secondary | ICD-10-CM | POA: Diagnosis present

## 2012-12-08 DIAGNOSIS — I6529 Occlusion and stenosis of unspecified carotid artery: Secondary | ICD-10-CM | POA: Diagnosis present

## 2012-12-08 DIAGNOSIS — Z87891 Personal history of nicotine dependence: Secondary | ICD-10-CM

## 2012-12-08 DIAGNOSIS — I739 Peripheral vascular disease, unspecified: Principal | ICD-10-CM | POA: Diagnosis present

## 2012-12-08 DIAGNOSIS — M869 Osteomyelitis, unspecified: Secondary | ICD-10-CM | POA: Diagnosis present

## 2012-12-08 HISTORY — PX: FEMORAL-TIBIAL BYPASS GRAFT: SHX938

## 2012-12-08 SURGERY — CREATION, BYPASS, ARTERIAL, FEMORAL TO TIBIAL, USING GRAFT
Anesthesia: General | Site: Leg Upper | Laterality: Right | Wound class: Clean

## 2012-12-08 MED ORDER — PROMETHAZINE HCL 25 MG/ML IJ SOLN: 6.2500 mg | INTRAMUSCULAR | Status: DC | PRN

## 2012-12-08 MED ORDER — HEMOSTATIC AGENTS (NO CHARGE) OPTIME
TOPICAL | Status: DC | PRN
  Administered 2012-12-08: 1 via TOPICAL

## 2012-12-08 MED ORDER — OXYCODONE HCL 5 MG PO TABS: 5.0000 mg | ORAL_TABLET | Freq: Once | ORAL | Status: DC | PRN

## 2012-12-08 MED ORDER — SODIUM CHLORIDE 0.9 % IV SOLN: 500.0000 mL | Freq: Once | INTRAVENOUS | Status: AC | PRN

## 2012-12-08 MED ORDER — MEPERIDINE HCL 25 MG/ML IJ SOLN
INTRAMUSCULAR | Status: AC
  Filled 2012-12-08: qty 1

## 2012-12-08 MED ORDER — ASPIRIN 81 MG PO CHEW
81.0000 mg | CHEWABLE_TABLET | Freq: Every day | ORAL | Status: DC
  Administered 2012-12-09 – 2012-12-11 (×3): 81 mg via ORAL
  Filled 2012-12-08 (×3): qty 1

## 2012-12-08 MED ORDER — HYDROMORPHONE HCL PF 1 MG/ML IJ SOLN
INTRAMUSCULAR | Status: AC
  Filled 2012-12-08: qty 1

## 2012-12-08 MED ORDER — PHENOL 1.4 % MT LIQD: 1.0000 | OROMUCOSAL | Status: DC | PRN

## 2012-12-08 MED ORDER — PROPOFOL 10 MG/ML IV BOLUS
INTRAVENOUS | Status: DC | PRN
  Administered 2012-12-08: 120 mg via INTRAVENOUS

## 2012-12-08 MED ORDER — MIDAZOLAM HCL 5 MG/5ML IJ SOLN
INTRAMUSCULAR | Status: DC | PRN
  Administered 2012-12-08 (×2): 1 mg via INTRAVENOUS

## 2012-12-08 MED ORDER — OXYCODONE HCL 5 MG/5ML PO SOLN: 5.0000 mg | Freq: Once | ORAL | Status: DC | PRN

## 2012-12-08 MED ORDER — GLYCOPYRROLATE 0.2 MG/ML IJ SOLN
INTRAMUSCULAR | Status: DC | PRN
  Administered 2012-12-08: .4 mg via INTRAVENOUS

## 2012-12-08 MED ORDER — PHENYLEPHRINE HCL 10 MG/ML IJ SOLN
INTRAMUSCULAR | Status: DC | PRN
  Administered 2012-12-08 (×4): 40 ug via INTRAVENOUS

## 2012-12-08 MED ORDER — ARTIFICIAL TEARS OP OINT
TOPICAL_OINTMENT | OPHTHALMIC | Status: DC | PRN
  Administered 2012-12-08: 1 via OPHTHALMIC

## 2012-12-08 MED ORDER — HEPARIN SODIUM (PORCINE) 1000 UNIT/ML IJ SOLN
INTRAMUSCULAR | Status: DC | PRN
  Administered 2012-12-08: 8000 [IU] via INTRAVENOUS
  Administered 2012-12-08: 2000 [IU] via INTRAVENOUS

## 2012-12-08 MED ORDER — PANTOPRAZOLE SODIUM 40 MG PO TBEC
40.0000 mg | DELAYED_RELEASE_TABLET | Freq: Every day | ORAL | Status: DC
  Administered 2012-12-08 – 2012-12-11 (×4): 40 mg via ORAL
  Filled 2012-12-08 (×4): qty 1

## 2012-12-08 MED ORDER — VANCOMYCIN HCL 10 G IV SOLR
1250.0000 mg | Freq: Two times a day (BID) | INTRAVENOUS | Status: DC
  Administered 2012-12-08 – 2012-12-10 (×4): 1250 mg via INTRAVENOUS
  Filled 2012-12-08 (×6): qty 1250

## 2012-12-08 MED ORDER — MEPERIDINE HCL 25 MG/ML IJ SOLN
6.2500 mg | INTRAMUSCULAR | Status: DC | PRN
  Administered 2012-12-08: 0.5 mg via INTRAVENOUS
  Administered 2012-12-08: 12.5 mg via INTRAVENOUS

## 2012-12-08 MED ORDER — PROTAMINE SULFATE 10 MG/ML IV SOLN
INTRAVENOUS | Status: DC | PRN
  Administered 2012-12-08: 25 mg via INTRAVENOUS

## 2012-12-08 MED ORDER — ALUM & MAG HYDROXIDE-SIMETH 200-200-20 MG/5ML PO SUSP: 15.0000 mL | ORAL | Status: DC | PRN

## 2012-12-08 MED ORDER — FENTANYL CITRATE 0.05 MG/ML IJ SOLN
INTRAMUSCULAR | Status: DC | PRN
  Administered 2012-12-08: 50 ug via INTRAVENOUS
  Administered 2012-12-08: 150 ug via INTRAVENOUS
  Administered 2012-12-08 (×2): 50 ug via INTRAVENOUS
  Administered 2012-12-08: 100 ug via INTRAVENOUS
  Administered 2012-12-08 (×4): 50 ug via INTRAVENOUS

## 2012-12-08 MED ORDER — ROCURONIUM BROMIDE 100 MG/10ML IV SOLN
INTRAVENOUS | Status: DC | PRN
  Administered 2012-12-08: 50 mg via INTRAVENOUS

## 2012-12-08 MED ORDER — LACTATED RINGERS IV SOLN
INTRAVENOUS | Status: DC | PRN
  Administered 2012-12-08 (×4): via INTRAVENOUS

## 2012-12-08 MED ORDER — NEOSTIGMINE METHYLSULFATE 1 MG/ML IJ SOLN
INTRAMUSCULAR | Status: DC | PRN
  Administered 2012-12-08: 3 mg via INTRAVENOUS

## 2012-12-08 MED ORDER — ONDANSETRON HCL 4 MG/2ML IJ SOLN: 4.0000 mg | Freq: Four times a day (QID) | INTRAMUSCULAR | Status: DC | PRN

## 2012-12-08 MED ORDER — GUAIFENESIN-DM 100-10 MG/5ML PO SYRP: 15.0000 mL | ORAL_SOLUTION | ORAL | Status: DC | PRN

## 2012-12-08 MED ORDER — ACETAMINOPHEN 325 MG PO TABS: 325.0000 mg | ORAL_TABLET | ORAL | Status: DC | PRN

## 2012-12-08 MED ORDER — SODIUM CHLORIDE 0.9 % IR SOLN
Status: DC | PRN
  Administered 2012-12-08: 11:00:00

## 2012-12-08 MED ORDER — 0.9 % SODIUM CHLORIDE (POUR BTL) OPTIME
TOPICAL | Status: DC | PRN
  Administered 2012-12-08: 2000 mL

## 2012-12-08 MED ORDER — LACTATED RINGERS IV SOLN
INTRAVENOUS | Status: DC
  Administered 2012-12-08: 10:00:00 via INTRAVENOUS

## 2012-12-08 MED ORDER — ONDANSETRON HCL 4 MG/2ML IJ SOLN
INTRAMUSCULAR | Status: DC | PRN
  Administered 2012-12-08: 4 mg via INTRAVENOUS

## 2012-12-08 MED ORDER — DOCUSATE SODIUM 100 MG PO CAPS
100.0000 mg | ORAL_CAPSULE | Freq: Every day | ORAL | Status: DC
  Administered 2012-12-09 – 2012-12-10 (×2): 100 mg via ORAL
  Filled 2012-12-08 (×4): qty 1

## 2012-12-08 MED ORDER — HYDROMORPHONE HCL 2 MG PO TABS
4.0000 mg | ORAL_TABLET | ORAL | Status: DC | PRN
  Administered 2012-12-09: 4 mg via ORAL
  Filled 2012-12-08: qty 2

## 2012-12-08 MED ORDER — MIDAZOLAM HCL 2 MG/2ML IJ SOLN: 0.5000 mg | Freq: Once | INTRAMUSCULAR | Status: DC | PRN

## 2012-12-08 MED ORDER — LABETALOL HCL 5 MG/ML IV SOLN
10.0000 mg | INTRAVENOUS | Status: DC | PRN
  Filled 2012-12-08: qty 4

## 2012-12-08 MED ORDER — HYDROMORPHONE HCL PF 1 MG/ML IJ SOLN
0.5000 mg | INTRAMUSCULAR | Status: DC | PRN
  Administered 2012-12-08 – 2012-12-09 (×4): 1 mg via INTRAVENOUS
  Filled 2012-12-08 (×4): qty 1

## 2012-12-08 MED ORDER — HYDROMORPHONE HCL PF 1 MG/ML IJ SOLN
0.2500 mg | INTRAMUSCULAR | Status: DC | PRN
  Administered 2012-12-08 (×4): 0.5 mg via INTRAVENOUS

## 2012-12-08 MED ORDER — POTASSIUM CHLORIDE CRYS ER 20 MEQ PO TBCR: 20.0000 meq | EXTENDED_RELEASE_TABLET | Freq: Once | ORAL | Status: AC | PRN

## 2012-12-08 MED ORDER — SODIUM CHLORIDE 0.9 % IV SOLN
INTRAVENOUS | Status: DC
  Administered 2012-12-08: 19:00:00 via INTRAVENOUS

## 2012-12-08 MED ORDER — DOPAMINE-DEXTROSE 3.2-5 MG/ML-% IV SOLN
3.0000 ug/kg/min | INTRAVENOUS | Status: DC | PRN
  Filled 2012-12-08: qty 250

## 2012-12-08 MED ORDER — ASPIRIN 81 MG PO TABS: 81.0000 mg | ORAL_TABLET | Freq: Every day | ORAL | Status: DC

## 2012-12-08 MED ORDER — ACETAMINOPHEN 650 MG RE SUPP: 325.0000 mg | RECTAL | Status: DC | PRN

## 2012-12-08 MED ORDER — HYDRALAZINE HCL 20 MG/ML IJ SOLN
10.0000 mg | INTRAMUSCULAR | Status: DC | PRN
  Filled 2012-12-08: qty 0.5

## 2012-12-08 MED ORDER — METOPROLOL TARTRATE 1 MG/ML IV SOLN: 2.0000 mg | INTRAVENOUS | Status: DC | PRN

## 2012-12-08 MED ORDER — LIDOCAINE HCL (CARDIAC) 20 MG/ML IV SOLN
INTRAVENOUS | Status: DC | PRN
  Administered 2012-12-08: 60 mg via INTRAVENOUS

## 2012-12-08 SURGICAL SUPPLY — 75 items
ADH SKN CLS APL DERMABOND .7 (GAUZE/BANDAGES/DRESSINGS) ×7
BANDAGE ELASTIC 4 VELCRO ST LF (GAUZE/BANDAGES/DRESSINGS) IMPLANT
BANDAGE ESMARK 6X9 LF (GAUZE/BANDAGES/DRESSINGS) IMPLANT
BNDG CMPR 9X6 STRL LF SNTH (GAUZE/BANDAGES/DRESSINGS) ×1
BNDG ESMARK 6X9 LF (GAUZE/BANDAGES/DRESSINGS) ×2
CANISTER SUCTION 2500CC (MISCELLANEOUS) ×2 IMPLANT
CLIP TI MEDIUM 24 (CLIP) ×2 IMPLANT
CLIP TI WIDE RED SMALL 24 (CLIP) ×3 IMPLANT
CLIP TI WIDE RED SMALL 6 (CLIP) ×1 IMPLANT
CLOTH BEACON ORANGE TIMEOUT ST (SAFETY) ×2 IMPLANT
COVER SURGICAL LIGHT HANDLE (MISCELLANEOUS) ×2 IMPLANT
CUFF TOURNIQUET SINGLE 24IN (TOURNIQUET CUFF) ×1 IMPLANT
CUFF TOURNIQUET SINGLE 34IN LL (TOURNIQUET CUFF) IMPLANT
CUFF TOURNIQUET SINGLE 44IN (TOURNIQUET CUFF) IMPLANT
DERMABOND ADVANCED (GAUZE/BANDAGES/DRESSINGS) ×7
DERMABOND ADVANCED .7 DNX12 (GAUZE/BANDAGES/DRESSINGS) ×1 IMPLANT
DRAIN CHANNEL 15F RND FF W/TCR (WOUND CARE) IMPLANT
DRAPE WARM FLUID 44X44 (DRAPE) ×2 IMPLANT
DRAPE X-RAY CASS 24X20 (DRAPES) IMPLANT
DRSG COVADERM 4X10 (GAUZE/BANDAGES/DRESSINGS) IMPLANT
DRSG COVADERM 4X8 (GAUZE/BANDAGES/DRESSINGS) IMPLANT
ELECT CAUTERY BLADE 6.4 (BLADE) ×1 IMPLANT
ELECT REM PT RETURN 9FT ADLT (ELECTROSURGICAL) ×4
ELECTRODE REM PT RTRN 9FT ADLT (ELECTROSURGICAL) ×1 IMPLANT
EVACUATOR SILICONE 100CC (DRAIN) IMPLANT
GLOVE BIO SURGEON STRL SZ 6.5 (GLOVE) ×1 IMPLANT
GLOVE BIO SURGEON STRL SZ7 (GLOVE) ×2 IMPLANT
GLOVE BIOGEL PI IND STRL 6.5 (GLOVE) IMPLANT
GLOVE BIOGEL PI IND STRL 7.0 (GLOVE) IMPLANT
GLOVE BIOGEL PI IND STRL 7.5 (GLOVE) ×1 IMPLANT
GLOVE BIOGEL PI INDICATOR 6.5 (GLOVE) ×5
GLOVE BIOGEL PI INDICATOR 7.0 (GLOVE) ×2
GLOVE BIOGEL PI INDICATOR 7.5 (GLOVE) ×2
GLOVE ECLIPSE 6.5 STRL STRAW (GLOVE) ×1 IMPLANT
GLOVE SURG SS PI 6.5 STRL IVOR (GLOVE) ×1 IMPLANT
GLOVE SURG SS PI 7.5 STRL IVOR (GLOVE) ×3 IMPLANT
GOWN PREVENTION PLUS XXLARGE (GOWN DISPOSABLE) ×3 IMPLANT
GOWN STRL NON-REIN LRG LVL3 (GOWN DISPOSABLE) ×7 IMPLANT
HEMOSTAT SNOW SURGICEL 2X4 (HEMOSTASIS) ×2 IMPLANT
HEMOSTAT SURGICEL 2X14 (HEMOSTASIS) IMPLANT
KIT BASIN OR (CUSTOM PROCEDURE TRAY) ×2 IMPLANT
KIT ROOM TURNOVER OR (KITS) ×2 IMPLANT
LOOP VESSEL MAXI BLUE (MISCELLANEOUS) ×1 IMPLANT
MARKER GRAFT CORONARY BYPASS (MISCELLANEOUS) IMPLANT
NS IRRIG 1000ML POUR BTL (IV SOLUTION) ×5 IMPLANT
PACK PERIPHERAL VASCULAR (CUSTOM PROCEDURE TRAY) ×2 IMPLANT
PAD ARMBOARD 7.5X6 YLW CONV (MISCELLANEOUS) ×4 IMPLANT
PADDING CAST COTTON 6X4 STRL (CAST SUPPLIES) ×1 IMPLANT
PENCIL BUTTON HOLSTER BLD 10FT (ELECTRODE) ×1 IMPLANT
SET COLLECT BLD 21X3/4 12 (NEEDLE) IMPLANT
SPONGE LAP 18X18 X RAY DECT (DISPOSABLE) ×2 IMPLANT
STAPLER VISISTAT 35W (STAPLE) IMPLANT
STOPCOCK 4 WAY LG BORE MALE ST (IV SETS) IMPLANT
SUT ETHILON 3 0 PS 1 (SUTURE) ×2 IMPLANT
SUT PROLENE 5 0 C 1 24 (SUTURE) ×4 IMPLANT
SUT PROLENE 6 0 BV (SUTURE) ×5 IMPLANT
SUT PROLENE 7 0 BV 1 (SUTURE) ×1 IMPLANT
SUT SILK 2 0 (SUTURE) ×2
SUT SILK 2 0 SH (SUTURE) ×2 IMPLANT
SUT SILK 2-0 18XBRD TIE 12 (SUTURE) IMPLANT
SUT SILK 3 0 (SUTURE) ×4
SUT SILK 3-0 18XBRD TIE 12 (SUTURE) IMPLANT
SUT SILK 4 0 (SUTURE) ×2
SUT SILK 4-0 18XBRD TIE 12 (SUTURE) IMPLANT
SUT VIC AB 2-0 CT1 27 (SUTURE) ×4
SUT VIC AB 2-0 CT1 TAPERPNT 27 (SUTURE) ×2 IMPLANT
SUT VIC AB 3-0 SH 27 (SUTURE) ×10
SUT VIC AB 3-0 SH 27X BRD (SUTURE) ×2 IMPLANT
SUT VICRYL 4-0 PS2 18IN ABS (SUTURE) ×6 IMPLANT
TOWEL OR 17X24 6PK STRL BLUE (TOWEL DISPOSABLE) ×4 IMPLANT
TOWEL OR 17X26 10 PK STRL BLUE (TOWEL DISPOSABLE) ×4 IMPLANT
TRAY FOLEY CATH 14FRSI W/METER (CATHETERS) ×2 IMPLANT
TUBING EXTENTION W/L.L. (IV SETS) IMPLANT
UNDERPAD 30X30 INCONTINENT (UNDERPADS AND DIAPERS) ×2 IMPLANT
WATER STERILE IRR 1000ML POUR (IV SOLUTION) ×2 IMPLANT

## 2012-12-08 NOTE — Anesthesia Preprocedure Evaluation (Addendum)
Anesthesia Evaluation  Patient identified by MRN, date of birth, ID band Patient awake    Reviewed: Allergy & Precautions, H&P , NPO status , Patient's Chart, lab work & pertinent test results  History of Anesthesia Complications Negative for: history of anesthetic complications  Airway Mallampati: II TM Distance: >3 FB Neck ROM: Full    Dental  (+) Edentulous Upper and Edentulous Lower   Pulmonary neg pneumonia -, COPDformer smoker,  breath sounds clear to auscultation  Pulmonary exam normal       Cardiovascular + Peripheral Vascular Disease Rhythm:Regular Rate:Normal  2/14 stress test: normal perfusion, no ischemia,  EF 63%   Neuro/Psych negative neurological ROS     GI/Hepatic negative GI ROS, Neg liver ROS,   Endo/Other  negative endocrine ROS  Renal/GU negative Renal ROS     Musculoskeletal   Abdominal   Peds  Hematology   Anesthesia Other Findings   Reproductive/Obstetrics                          Anesthesia Physical Anesthesia Plan  ASA: III  Anesthesia Plan: General   Post-op Pain Management:    Induction: Intravenous  Airway Management Planned: Oral ETT  Additional Equipment:   Intra-op Plan:   Post-operative Plan: Extubation in OR  Informed Consent: I have reviewed the patients History and Physical, chart, labs and discussed the procedure including the risks, benefits and alternatives for the proposed anesthesia with the patient or authorized representative who has indicated his/her understanding and acceptance.     Plan Discussed with: Surgeon and CRNA  Anesthesia Plan Comments: (Plan routine monitors, GETA )        Anesthesia Quick Evaluation

## 2012-12-08 NOTE — Anesthesia Procedure Notes (Addendum)
Performed by: Cathie Olden B   Procedure Name: Intubation Date/Time: 12/08/2012 10:47 AM Performed by: Sherie Don Pre-anesthesia Checklist: Patient identified and Emergency Drugs available Patient Re-evaluated:Patient Re-evaluated prior to inductionOxygen Delivery Method: Circle system utilized Preoxygenation: Pre-oxygenation with 100% oxygen Intubation Type: IV induction Ventilation: Mask ventilation without difficulty Laryngoscope Size: Miller and 2 Grade View: Grade II Tube size: 7.5 mm Number of attempts: 1 Placement Confirmation: ETT inserted through vocal cords under direct vision,  positive ETCO2 and breath sounds checked- equal and bilateral Secured at: 23 cm Tube secured with: Tape Dental Injury: Teeth and Oropharynx as per pre-operative assessment

## 2012-12-08 NOTE — Anesthesia Postprocedure Evaluation (Signed)
  Anesthesia Post-op Note  Patient: Joshua Walter  Procedure(s) Performed: Procedure(s) with comments: BYPASS GRAFT FEMORAL-TIBIAL ARTERY (Right) - Ultrasound guided  Patient Location: PACU  Anesthesia Type:General  Level of Consciousness: awake, alert  and oriented  Airway and Oxygen Therapy: Patient Spontanous Breathing and Patient connected to nasal cannula oxygen  Post-op Pain: mild  Post-op Assessment: Post-op Vital signs reviewed, Patient's Cardiovascular Status Stable, Respiratory Function Stable, No signs of Nausea or vomiting and Pain level controlled  Post-op Vital Signs: stable  Complications: No apparent anesthesia complications

## 2012-12-08 NOTE — Interval H&P Note (Signed)
History and Physical Interval Note:  12/08/2012 9:57 AM  Joshua Walter  has presented today for surgery, with the diagnosis of Peripheral Vascular Disease with nonhealing ulcer  The various methods of treatment have been discussed with the patient and family. After consideration of risks, benefits and other options for treatment, the patient has consented to  Procedure(s) with comments: BYPASS GRAFT FEMORAL-TIBIAL ARTERY (Right) - Right Femoral to Anterior Tibial Bypass Graft as a surgical intervention .  The patient's history has been reviewed, patient examined, no change in status, stable for surgery.  I have reviewed the patient's chart and labs.  Questions were answered to the patient's satisfaction.     BRABHAM IV, V. WELLS

## 2012-12-08 NOTE — Transfer of Care (Signed)
Immediate Anesthesia Transfer of Care Note  Patient: Joshua Walter  Procedure(s) Performed: Procedure(s) with comments: BYPASS GRAFT FEMORAL-TIBIAL ARTERY (Right) - Ultrasound guided  Patient Location: PACU  Anesthesia Type:General  Level of Consciousness: sedated  Airway & Oxygen Therapy: Patient Spontanous Breathing and Patient connected to face mask oxygen  Post-op Assessment: Report given to PACU RN and Post -op Vital signs reviewed and stable  Post vital signs: Reviewed and stable  Complications: No apparent anesthesia complications

## 2012-12-08 NOTE — Preoperative (Signed)
Beta Blockers   Reason not to administer Beta Blockers:Not Applicable 

## 2012-12-08 NOTE — Progress Notes (Signed)
  ANTIBIOTIC CONSULT NOTE - INITIAL  Pharmacy Consult for vancomycin Indication: right heel infection  No Known Allergies  Patient Measurements: Weight: 177 lb 0.5 oz (80.3 kg) Adjusted Body Weight: 80kg  Vital Signs: Temp: 98.6 F (37 C) (03/21 1811) Temp src: Oral (03/21 1811) BP: 135/73 mmHg (03/21 1739) Pulse Rate: 80 (03/21 1745) Intake/Output from previous day:   Intake/Output from this shift: Total I/O In: 3500 [I.V.:3500] Out: 625 [Urine:475; Blood:150]  Labs: No results found for this basename: WBC, HGB, PLT, LABCREA, CREATININE,  in the last 72 hours The CrCl is unknown because both a height and weight (above a minimum accepted value) are required for this calculation. No results found for this basename: VANCOTROUGH, Leodis Binet, VANCORANDOM, GENTTROUGH, GENTPEAK, GENTRANDOM, TOBRATROUGH, TOBRAPEAK, TOBRARND, AMIKACINPEAK, AMIKACINTROU, AMIKACIN,  in the last 72 hours   Microbiology: Recent Results (from the past 720 hour(s))  SURGICAL PCR SCREEN     Status: None   Collection Time    11/29/12  1:55 PM      Result Value Range Status   MRSA, PCR NEGATIVE  NEGATIVE Final   Staphylococcus aureus NEGATIVE  NEGATIVE Final   Comment:            The Xpert SA Assay (FDA     approved for NASAL specimens     in patients over 80 years of age),     is one component of     a comprehensive surveillance     program.  Test performance has     been validated by [employer] for patients greater     than or equal to 72 year old.     It is not intended     to diagnose infection nor to     guide or monitor treatment.    Medical History: Past Medical History  Diagnosis Date  . Cellulitis and abscess of foot, except toes   . Pneumonia     hx  . Heart murmur   . Peripheral vascular disease     rt femoral occulsion    Medications:  Prescriptions prior to admission  Medication Sig Dispense Refill  . aspirin 81 MG tablet Take 81 mg by mouth daily.      Marland Kitchen  HYDROmorphone (DILAUDID) 4 MG tablet Take 1 tablet by mouth every 4 (four) hours.      . Multiple Vitamin (MULTIVITAMIN WITH MINERALS) TABS Take 1 tablet by mouth daily.      . vancomycin (VANCOCIN) 1 GM/200ML SOLN Inject 1,250 mg into the vein 2 (two) times daily. Started on 10/27/12. [facility]. Expected length of treatment is approximately 6-8 weeks.       Assessment: Mr. Bricco is a 62 year old man s/p surgery today -  BYPASS GRAFT FEMORAL-TIBIAL ARTERY (Right) Vancomycin to be continued post op.   He has been on IV vancomycin via PICC line since 10/27/12.  Today would mark 6 weeks of therapy. Goal of Therapy:  Vancomycin trough level 15-20 mcg/ml  Plan:  Continue vancomycin 1250mg  IV q12 starting this evening.   Will monitor renal function and culture results if any. Will check a vancomycin trough if therapy to continue for an extended period of time.  Mickeal Skinner 12/08/2012,6:30 PM

## 2012-12-08 NOTE — H&P (View-Only) (Signed)
[facility] [place]   Patient name: Joshua Walter MRN: [id number] DOB: [birth year] Sex: male     Chief Complaint  Patient presents with  . Re-evaluation    2 wk f/u abdominal aortogram w/ LE vein mapping and carotid pre op    HISTORY OF PRESENT ILLNESS: The patient is back today for followup. He was referred for evaluation of a right heel ulcer. The patient states that he had a thorn go into his heel back in June of 2013. He used tweezers to try to get it out. He subsequently developed an ulcer. This has been debrided as well as explored. Multiple imaging studies showed no evidence of a foreign body. He was recently found to have osteomyelitis and was started on IV antibiotics. His IV antibiotics started 3 days ago. He reports no fevers or chills.  The patient states that he has diet-controlled hypertension. He has a history of smoking but quit in 2005. He also suffers from degenerative joint disease. He states that he has pain in his right foot at night which is alleviated by hanging his foot over the bed. He performs daily dressing changes.  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   Past Medical History  Diagnosis Date  . Cellulitis and abscess of foot, except toes     Past Surgical History  Procedure Laterality Date  . Spine surgery  1997    Lumbar spine  . Abdominal surgery      2009    History   Social History  . Marital Status: Married    Spouse Name: N/A    Number of Children: N/A  . Years of Education: N/A   Occupational History  . Not on file.   Social History Main Topics  . Smoking status: Former Smoker    Types: Cigarettes    Quit date: 09/20/2004  . Smokeless tobacco: Not on file  . Alcohol Use: Yes  . Drug Use: Not on file  . Sexually Active: Not on file   Other Topics Concern  .  Not on file   Social History Narrative  . No narrative on file    Family History  Problem Relation Age of Onset  . Cancer Mother     Allergies as of 11/20/2012  . (No Known Allergies)    Current Outpatient Prescriptions on File Prior to Visit  Medication Sig Dispense Refill  . Multiple Vitamin (MULTIVITAMIN WITH MINERALS) TABS Take 1 tablet by mouth daily.      . vancomycin (VANCOCIN) 1 GM/200ML SOLN Inject 1,250 mg into the vein 2 (two) times daily. Started on 10/27/12. [facility]. Expected length of treatment is approximately 6-8 weeks.      Marland Kitchen HYDROcodone-acetaminophen (NORCO) 7.5-325 MG per tablet Take 1 tablet by mouth every 4 (four) hours as needed for pain. For pain.       No current facility-administered medications on file prior to visit.     REVIEW OF SYSTEMS: No changes since prior visit  PHYSICAL EXAMINATION:   Vital signs are BP 146/83  Pulse 78  Ht 5\' 10"  (1.778 m)  Wt 173 lb 3.2 oz (78.563 kg)  BMI 24.85 kg/m2  SpO2 97% General: The patient appears their stated age. HEENT:  No gross abnormalities Pulmonary:  Non labored breathing Musculoskeletal: There are no major deformities. Skin: Right heel ulcer is unchanged.Marland Kitchen  Psychiatric: The patient has normal affect. Cardiovascular: There is a regular rate and rhythm without significant murmur appreciated.   Diagnostic Studies Carotid ultrasound shows less than 40% stenosis bilaterally. Vein mapping shows an adequate right greater saphenous vein Myocardial imaging showed no evidence of ischemia.  Assessment: Right heel ulcer Plan: I believe the patient's best chance for healing his wound is going to be a right femoral to anterior tibial artery bypass graft with ipsilateral saphenous vein. I discussed the risks and benefits of the procedure. We discussed the need for long-term surveillance of the bypass as well as possibility of premature bypass graft failure. We also discussed that U. and with adequate  blood flow he could have problems healing the ulcer on his heel. His operation has been scheduled for March 20. It was delayed because of his wife's work schedule. I have made sure that he starts a baby aspirin today.  Jorge Ny, M.D. [facility] [place] Office: [phone] Pager:  [phone]

## 2012-12-09 LAB — CBC
HCT: 34.9 % — ABNORMAL LOW (ref 39.0–52.0)
Hemoglobin: 11.9 g/dL — ABNORMAL LOW (ref 13.0–17.0)
MCH: 31.1 pg (ref 26.0–34.0)
MCV: 91.1 fL (ref 78.0–100.0)
Platelets: 221 10*3/uL (ref 150–400)
RBC: 3.83 MIL/uL — ABNORMAL LOW (ref 4.22–5.81)
WBC: 13.1 10*3/uL — ABNORMAL HIGH (ref 4.0–10.5)

## 2012-12-09 LAB — BASIC METABOLIC PANEL
CO2: 25 mEq/L (ref 19–32)
Calcium: 8.6 mg/dL (ref 8.4–10.5)
Chloride: 103 mEq/L (ref 96–112)
Creatinine, Ser: 0.74 mg/dL (ref 0.50–1.35)
Glucose, Bld: 105 mg/dL — ABNORMAL HIGH (ref 70–99)

## 2012-12-09 MED ORDER — HYDROMORPHONE HCL 2 MG PO TABS
8.0000 mg | ORAL_TABLET | ORAL | Status: DC | PRN
  Administered 2012-12-09 – 2012-12-11 (×12): 8 mg via ORAL
  Filled 2012-12-09 (×13): qty 4

## 2012-12-09 MED ORDER — SODIUM CHLORIDE 0.9 % IJ SOLN
INTRAMUSCULAR | Status: AC
  Administered 2012-12-09: 10 mL
  Filled 2012-12-09: qty 20

## 2012-12-09 NOTE — Evaluation (Signed)
Physical Therapy Evaluation Patient Details Name: Joshua Walter MRN: [id number] DOB: December 26, 1950 Today's Date: 12/09/2012 Time: 1030-1107 PT Time Calculation (min): 37 min  PT Assessment / Plan / Recommendation Clinical Impression  Patient is a 62 y/o male admitted for right LE revascularization with right femoral to ant tib bypass due to osteomyleitis with non healing heel wound and arterial occlusion.  He presents with acute pain, decreased balance, decreased AROM and strength right LE all limiting independence with mobility and will benefit from skilled PT in the acute setting to allow d/c home with wife assist.    PT Assessment  Patient needs continued PT services    Follow Up Recommendations  Home health PT;No PT follow up (depending on progress)          Equipment Recommendations  Rolling walker with 5" wheels    Recommendations for Other Services   None  Frequency Min 3X/week    Precautions / Restrictions Precautions Precautions: Fall Restrictions Weight Bearing Restrictions: No   Pertinent Vitals/Pain 8/10 right leg/groin with activity      Mobility  Bed Mobility Bed Mobility: Supine to Sit Supine to Sit: 5: Supervision;HOB flat;With rails Transfers Transfers: Sit to Stand;Stand to Sit Sit to Stand: 4: Min assist;From bed;From toilet;4: Min guard Stand to Sit: 4: Min guard;To toilet;To bed Details for Transfer Assistance: cues for safety due to pulling up from toliet on door handle Ambulation/Gait Ambulation/Gait Assistance: 4: Min guard Ambulation Distance (Feet): 90 Feet Assistive device: Rolling walker Ambulation/Gait Assistance Details: antalgic with about TTWB on right foot (heel ulcer) Gait Pattern: Step-to pattern;Antalgic;Decreased hip/knee flexion - right;Right flexed knee in stance        PT Diagnosis: Difficulty walking;Acute pain  PT Problem List: Decreased strength;Decreased range of motion;Decreased activity tolerance;Decreased  balance;Decreased mobility;Pain;Decreased knowledge of use of DME PT Treatment Interventions: DME instruction;Gait training;Balance training;Patient/family education;Functional mobility training;Therapeutic activities;Therapeutic exercise   PT Goals Acute Rehab PT Goals PT Goal Formulation: With patient Time For Goal Achievement: 12/16/12 Potential to Achieve Goals: Good Pt will go Supine/Side to Sit: with modified independence PT Goal: Supine/Side to Sit - Progress: Goal set today Pt will go Sit to Supine/Side: with modified independence PT Goal: Sit to Supine/Side - Progress: Goal set today Pt will go Sit to Stand: with modified independence PT Goal: Sit to Stand - Progress: Goal set today Pt will go Stand to Sit: with modified independence PT Goal: Stand to Sit - Progress: Goal set today Pt will Ambulate: >150 feet;with modified independence;with rolling walker PT Goal: Ambulate - Progress: Goal set today Pt will Perform Home Exercise Program: Independently PT Goal: Perform Home Exercise Program - Progress: Goal set today  Visit Information  Last PT Received On: 12/09/12 Assistance Needed: +1    Subjective Data  Subjective: I've been up to chair and to bathroom. Patient Stated Goal: To return to independent   Prior Functioning  Home Living Lives With: Spouse Available Help at Discharge: Family Type of Home: House Home Access: Level entry (threshold) Home Layout: One level Bathroom Shower/Tub: Tub/shower unit;Walk-in shower Home Adaptive Equipment: Crutches;Straight cane Additional Comments: walking boot Prior Function Level of Independence: Independent Driving: Yes Vocation: On disability Communication Communication: No difficulties    Cognition  Cognition Overall Cognitive Status: Appears within functional limits for tasks assessed/performed Arousal/Alertness: Awake/alert Orientation Level: Appears intact for tasks assessed Behavior During Session: [facility] for tasks  performed    Extremity/Trunk Assessment Right Upper Extremity Assessment RUE ROM/Strength/Tone: [facility] for tasks assessed Left Upper Extremity  Assessment LUE ROM/Strength/Tone: WFL for tasks assessed Right Lower Extremity Assessment RLE ROM/Strength/Tone: Deficits;Due to pain RLE ROM/Strength/Tone Deficits: groin and bilat lower leg incisions; moves gingerly with slightly limited AROM Left Lower Extremity Assessment LLE ROM/Strength/Tone: WFL for tasks assessed   Balance Balance Balance Assessed: Yes Static Sitting Balance Static Sitting - Balance Support: Feet supported;No upper extremity supported Static Sitting - Level of Assistance: 6: Modified independent (Device/Increase time) Static Sitting - Comment/# of Minutes: on bed and toilet Static Standing Balance Static Standing - Balance Support: Bilateral upper extremity supported Static Standing - Level of Assistance: 5: Stand by assistance  End of Session PT - End of Session Equipment Utilized During Treatment: Gait belt Activity Tolerance: Patient tolerated treatment well Patient left: in bed;with call bell/phone within reach;with nursing in room;with family/visitor present (sitting edge of bed)  GP     [facility] 12/09/2012, 11:16 AM  Sheran Lawless, PT [phone] 12/09/2012

## 2012-12-09 NOTE — Progress Notes (Addendum)
[facility] Progress Note  12/09/2012 8:32 AM POD 1  Subjective:  C/o soreness in his right groin as well as soreness just proximal and lateral to his knee  Tm 100 now 99.8 HR 70's-90's 130's-160's systolic (200 systolic immediately after surgery) 97% RA  Filed Vitals:   12/09/12 0755  BP: 112/64  Pulse: 89  Temp: 99.8 F (37.7 C)  Resp: 25    Physical Exam: Incisions:  All incisions are c/d/i; right groin incision looks good without hematoma Extremities:  + doppler signal in the right AT position.  Pt states right heel is unchanged and possibly slightly improved   CBC    Component Value Date/Time   WBC 13.1* 12/09/2012 0445   RBC 3.83* 12/09/2012 0445   HGB 11.9* 12/09/2012 0445   HCT 34.9* 12/09/2012 0445   PLT 221 12/09/2012 0445   MCV 91.1 12/09/2012 0445   MCH 31.1 12/09/2012 0445   MCHC 34.1 12/09/2012 0445   RDW 12.6 12/09/2012 0445   LYMPHSABS 2.0 12/21/2007 2125   MONOABS 0.5 12/21/2007 2125   EOSABS 0.0 12/21/2007 2125   BASOSABS 0.0 12/21/2007 2125    BMET    Component Value Date/Time   NA 138 12/09/2012 0445   K 3.8 12/09/2012 0445   CL 103 12/09/2012 0445   CO2 25 12/09/2012 0445   GLUCOSE 105* 12/09/2012 0445   BUN 7 12/09/2012 0445   CREATININE 0.74 12/09/2012 0445   CALCIUM 8.6 12/09/2012 0445   GFRNONAA >90 12/09/2012 0445   GFRAA >90 12/09/2012 0445    INR    Component Value Date/Time   INR 0.97 11/29/2012 1351     Intake/Output Summary (Last 24 hours) at 12/09/12 6295 Last data filed at 12/09/12 0800  Gross per 24 hour  Intake 4812.92 ml  Output   3525 ml  Net 1287.92 ml     Assessment/Plan:  62 y.o. male is s/p:  Right femoral to anterior tibial bypass   POD 1  -pt continued on Vancomycin for right heel infection -acute surgical blood loss anemia-pt tolerating -pt had trouble getting out of bed this am due to pain in right groin-continue to -mobilize.  OOB to chair.  PT consult and treat. -Continue to float right  heel -transfer to 2000 -foley out-pt has not voided yet -pt states that he is on dilaudid 8 mg every 3 hrs at home-will change his regimen here to 8 mg every 4 hrs   Doreatha Massed, PA-C [facility] [phone] 12/09/2012 8:32 AM    Agree with above Incisions healing nicely right leg 3+ pulse laterally and femoral-anterior tibial graft +3+ pulse in the foot-dorsalis pedis Ulcer right heel is stable Plan transfer to 2000 the day-held to DC home on Monday Patient requires large amounts of analgesics on a chronic basis-Dilaudid-8 mg every 3 hours by mouth

## 2012-12-10 LAB — VANCOMYCIN, TROUGH: Vancomycin Tr: 9.8 ug/mL — ABNORMAL LOW (ref 10.0–20.0)

## 2012-12-10 MED ORDER — VANCOMYCIN HCL 10 G IV SOLR
1500.0000 mg | Freq: Two times a day (BID) | INTRAVENOUS | Status: DC
  Administered 2012-12-10 – 2012-12-11 (×2): 1500 mg via INTRAVENOUS
  Filled 2012-12-10 (×3): qty 1500

## 2012-12-10 MED ORDER — VANCOMYCIN HCL 500 MG IV SOLR
500.0000 mg | INTRAVENOUS | Status: AC
  Administered 2012-12-10: 500 mg via INTRAVENOUS
  Filled 2012-12-10: qty 500

## 2012-12-10 MED ORDER — ALTEPLASE 2 MG IJ SOLR
2.0000 mg | Freq: Once | INTRAMUSCULAR | Status: AC
  Administered 2012-12-10: 2 mg
  Filled 2012-12-10: qty 2

## 2012-12-10 MED ORDER — SODIUM CHLORIDE 0.9 % IJ SOLN
10.0000 mL | INTRAMUSCULAR | Status: DC | PRN
  Administered 2012-12-10: 30 mL
  Administered 2012-12-11: 10 mL

## 2012-12-10 NOTE — Progress Notes (Addendum)
[facility] Progress Note  12/10/2012 8:30 AM POD 2  Subjective:  No complaints-states he is getting some feeling back in his toes. Mobilizing better today with walker.  Tm 99.8 now 98.3 VSS  Filed Vitals:   12/10/12 0353  BP: 120/91  Pulse: 88  Temp: 98.3 F (36.8 C)  Resp: 18    Physical Exam: Incisions:  All incisions are c/d/i. Extremities:  Right foot is warm and well perfused.  There is a good graft palpable pulse.  Purple discoloration of right heel/ankle is improving.  CBC    Component Value Date/Time   WBC 13.1* 12/09/2012 0445   RBC 3.83* 12/09/2012 0445   HGB 11.9* 12/09/2012 0445   HCT 34.9* 12/09/2012 0445   PLT 221 12/09/2012 0445   MCV 91.1 12/09/2012 0445   MCH 31.1 12/09/2012 0445   MCHC 34.1 12/09/2012 0445   RDW 12.6 12/09/2012 0445   LYMPHSABS 2.0 12/21/2007 2125   MONOABS 0.5 12/21/2007 2125   EOSABS 0.0 12/21/2007 2125   BASOSABS 0.0 12/21/2007 2125    BMET    Component Value Date/Time   NA 138 12/09/2012 0445   K 3.8 12/09/2012 0445   CL 103 12/09/2012 0445   CO2 25 12/09/2012 0445   GLUCOSE 105* 12/09/2012 0445   BUN 7 12/09/2012 0445   CREATININE 0.74 12/09/2012 0445   CALCIUM 8.6 12/09/2012 0445   GFRNONAA >90 12/09/2012 0445   GFRAA >90 12/09/2012 0445    INR    Component Value Date/Time   INR 0.97 11/29/2012 1351     Intake/Output Summary (Last 24 hours) at 12/10/12 0830 Last data filed at 12/10/12 0200  Gross per 24 hour  Intake    480 ml  Output   1600 ml  Net  -1120 ml     Assessment/Plan:  62 y.o. male is s/p:  Right femoral to anterior tibial bypass   POD 2  -continue mobilization -pt on Vanc-states he has 4 more weeks left--continue -graft is patent -possibly home tomorrow   Doreatha Massed, PA-C [facility] [phone] 12/10/2012 8:30 AM    Agree with above Surgical sites look good 3+ graft pulse femoral to anterior tibial bypass and 3+ dorsalis pedis pulse palpable and foot. Ulcer  right heel is stable.  Plan to continue to increase ambulation today and DC home in a.m.

## 2012-12-10 NOTE — Progress Notes (Addendum)
ANTIBIOTIC CONSULT NOTE - FOLLOW UP  Pharmacy Consult for vancomycin Indication: ankle osteomyelitis  No Known Allergies  Patient Measurements: Height: 5\' 10"  (177.8 cm) Weight: 177 lb 0.5 oz (80.3 kg) IBW/kg (Calculated) : 73  Vital Signs: Temp: 98.3 F (36.8 C) (03/23 0353) Temp src: Oral (03/23 0353) BP: 120/91 mmHg (03/23 0353) Pulse Rate: 88 (03/23 0353) Intake/Output from previous day: 03/22 0701 - 03/23 0700 In: 500 [P.O.:480; I.V.:20] Out: 1600 [Urine:1600] Intake/Output from this shift:    Labs:  Recent Labs  12/09/12 0445  WBC 13.1*  HGB 11.9*  PLT 221  CREATININE 0.74   Estimated Creatinine Clearance: 101.4 ml/min (by C-G formula based on Cr of 0.74).  Recent Labs  12/10/12 0635  VANCOTROUGH 9.8*     Microbiology: Recent Results (from the past 720 hour(s))  SURGICAL PCR SCREEN     Status: None   Collection Time    11/29/12  1:55 PM      Result Value Range Status   MRSA, PCR NEGATIVE  NEGATIVE Final   Staphylococcus aureus NEGATIVE  NEGATIVE Final   Comment:            The Xpert SA Assay (FDA     approved for NASAL specimens     in patients over 75 years of age),     is one component of     a comprehensive surveillance     program.  Test performance has     been validated by [employer] for patients greater     than or equal to 31 year old.     It is not intended     to diagnose infection nor to     guide or monitor treatment.    Anti-infectives   Start     Dose/Rate Route Frequency Ordered Stop   12/08/12 2000  vancomycin (VANCOCIN) 1,250 mg in sodium chloride 0.9 % 250 mL IVPB     1,250 mg 166.7 mL/hr over 90 Minutes Intravenous Every 12 hours 12/08/12 1837     12/07/12 1421  cefUROXime (ZINACEF) 1.5 g in dextrose 5 % 50 mL IVPB     1.5 g 100 mL/hr over 30 Minutes Intravenous 30 min pre-op 12/07/12 1421 12/08/12 1040      Assessment: Patient is a 62 y.o  M on vancomycin for ankle osteomyelitis.  [facility] reported that  patient  has been on vancomycin since 10/28/12 with stop date of 12/22/12.  Last vancomycin trough level drawn by them on 3/11 was 15.5.  Home vancomycin regimen of 1250mg  IV q12h was resumed on admission and patient received 3 doses prior to level drawn this morning.  Level is subtherapeutic at 9.8.  RN already gave the 1250mg  dose this morning at 8 AM.   Goal of Therapy:  Vancomycin trough level 15-20 mcg/ml  Plan:  1) vancomycin 500 mg IV x1 now, then increase dose to 1500mg  q12h   Joshua Walter P 12/10/2012,8:10 AM

## 2012-12-10 NOTE — Progress Notes (Signed)
Physical Therapy Treatment Patient Details Name: Joshua Walter MRN: [id number] DOB: September 30, 1950 Today's Date: 12/10/2012 Time: 4098-1191 PT Time Calculation (min): 13 min  PT Assessment / Plan / Recommendation Comments on Treatment Session  Patient did well with mobility.  Reports increased "soreness" in RLE, and preferred to ambulate NWB RLE.  Safe with use of RW.  Encouraged ankle pumps and elevation to help with edema.  Do not feel patient will need f/u PT at discharge.  Patient in agreement.    Follow Up Recommendations  No PT follow up;Supervision - Intermittent     Does the patient have the potential to tolerate intense rehabilitation     Barriers to Discharge        Equipment Recommendations  Rolling walker with 5" wheels    Recommendations for Other Services    Frequency Min 3X/week   Plan Discharge plan needs to be updated;Frequency remains appropriate    Precautions / Restrictions Precautions Precautions: Fall Restrictions Weight Bearing Restrictions: No   Pertinent Vitals/Pain     Mobility  Bed Mobility Bed Mobility: Supine to Sit;Sit to Supine Supine to Sit: 7: Independent Sit to Supine: 7: Independent Details for Bed Mobility Assistance: No cues or assist needed. Transfers Transfers: Sit to Stand;Stand to Sit Sit to Stand: 5: Supervision;With upper extremity assist;From bed Stand to Sit: 5: Supervision;With upper extremity assist;To bed Details for Transfer Assistance: Cues for safety Ambulation/Gait Ambulation/Gait Assistance: 5: Supervision Ambulation Distance (Feet): 180 Feet Assistive device: Rolling walker Ambulation/Gait Assistance Details: Verbal cues to use as normal a gait pattern as possible.  Patient did not want to put weight on RLE due to increased pain - ambulated NWB RLE today. Gait Pattern: Step-to pattern;Antalgic    Exercises General Exercises - Lower Extremity Ankle Circles/Pumps: AROM;Right;10 reps;Supine     PT Goals Acute  Rehab PT Goals PT Goal: Supine/Side to Sit - Progress: Met PT Goal: Sit to Supine/Side - Progress: Met PT Goal: Sit to Stand - Progress: Progressing toward goal PT Goal: Stand to Sit - Progress: Progressing toward goal PT Goal: Ambulate - Progress: Progressing toward goal  Visit Information  Last PT Received On: 12/10/12 Assistance Needed: +1    Subjective Data  Subjective: "I've been up to the bathroom"   Cognition  Cognition Overall Cognitive Status: Appears within functional limits for tasks assessed/performed Arousal/Alertness: Awake/alert Orientation Level: Oriented X4 / Intact Behavior During Session: [facility] for tasks performed    Balance     End of Session PT - End of Session Equipment Utilized During Treatment: Gait belt Activity Tolerance: Patient tolerated treatment well Patient left: in bed;with call bell/phone within reach (RLE elevated) Nurse Communication: Mobility status   GP     Vena Austria 12/10/2012, 8:56 AM Durenda Hurt. Renaldo Fiddler, [facility] Acute Rehab Services Pager [phone]

## 2012-12-11 ENCOUNTER — Encounter (HOSPITAL_COMMUNITY): Payer: Self-pay | Admitting: Surgery

## 2012-12-11 DIAGNOSIS — L98499 Non-pressure chronic ulcer of skin of other sites with unspecified severity: Secondary | ICD-10-CM

## 2012-12-11 DIAGNOSIS — I739 Peripheral vascular disease, unspecified: Secondary | ICD-10-CM

## 2012-12-11 MED ORDER — HEPARIN SOD (PORK) LOCK FLUSH 100 UNIT/ML IV SOLN
250.0000 [IU] | INTRAVENOUS | Status: AC | PRN
  Administered 2012-12-11: 500 [IU]

## 2012-12-11 MED ORDER — HYDROMORPHONE HCL 8 MG PO TABS: 8.0000 mg | ORAL_TABLET | ORAL | Status: DC | PRN

## 2012-12-11 NOTE — Progress Notes (Signed)
[facility] [place]  Progress Note Bypass Surgery  Date of Surgery: 12/08/2012  Procedure(s): BYPASS GRAFT FEMORAL-TIBIAL ARTERY Surgeon: Surgeon(s): Nada Libman, MD  3 Days Post-Op  History of Present Illness  Joshua Walter is a 62 y.o. male who is S/P Procedure(s): BYPASS GRAFT FEMORAL-TIBIAL ARTERY right.  The patient's pre-op symptoms of pain in the heel are Improved . Patients pain is fairly well controlled but still requiring pain meds  q 4 hours. Has been on 5 weeks of Vanc and was told he will need 4 more weeks.   Significant Diagnostic Studies: CBC Lab Results  Component Value Date   WBC 13.1* 12/09/2012   HGB 11.9* 12/09/2012   HCT 34.9* 12/09/2012   MCV 91.1 12/09/2012   PLT 221 12/09/2012    BMET     Component Value Date/Time   NA 138 12/09/2012 0445   K 3.8 12/09/2012 0445   CL 103 12/09/2012 0445   CO2 25 12/09/2012 0445   GLUCOSE 105* 12/09/2012 0445   BUN 7 12/09/2012 0445   CREATININE 0.74 12/09/2012 0445   CALCIUM 8.6 12/09/2012 0445   GFRNONAA >90 12/09/2012 0445   GFRAA >90 12/09/2012 0445    COAG Lab Results  Component Value Date   INR 0.97 11/29/2012   No results found for this basename: PTT    Physical Examination  BP Readings from Last 3 Encounters:  12/11/12 126/80  12/11/12 126/80  11/29/12 158/102   Temp Readings from Last 3 Encounters:  12/11/12 98.5 F (36.9 C) Oral  12/11/12 98.5 F (36.9 C) Oral  11/29/12 98.1 F (36.7 C)    SpO2 Readings from Last 3 Encounters:  12/11/12 98%  12/11/12 98%  11/29/12 97%   Pulse Readings from Last 3 Encounters:  12/11/12 90  12/11/12 90  11/29/12 89    Pt is A&O x 3 right lower extremity: Incision/s is/are clean,dry.intact, and  healing without hematoma, erythema or drainage Limb is warm; with good color Moderate swelling as expected Heel wound is dry and stable Right Dorsalis Pedis pulse is palpable  Assessment/Plan: Pt. Doing well Post-op pain is  controlled Wounds are healing well PT/OT for ambulation Continue wound care as ordered Cont Vanc per PICC line as pt was getting at home for Osteo in the right heel Home when pain controlled Elevate RLE Acute blood loss anemia post-op stable  Zahli Vetsch J [phone] 12/11/2012 8:09 AM

## 2012-12-11 NOTE — Evaluation (Signed)
Occupational Therapy Evaluation Patient Details Name: Joshua Walter MRN: [id number] DOB: March 29, 1951 Today's Date: 12/11/2012 Time: 5956-3875 OT Time Calculation (min): 23 min  OT Assessment / Plan / Recommendation Clinical Impression  Pt is 43 male s/p R LE revascularization R femoral ant. tib. bypass. He demonstrates Mod I-I LB dressing/bathing as well as functional mobility related to ADL's and transfers. He reports no further OT needs at this time and plans to d/c home w/ PRN assist from his spouse. Will sign off OT    OT Assessment  Patient does not need any further OT services    Follow Up Recommendations  No OT follow up    Barriers to Discharge      Equipment Recommendations       Recommendations for Other Services    Frequency       Precautions / Restrictions Precautions Precautions: Fall Restrictions Weight Bearing Restrictions: No   Pertinent Vitals/Pain Pt reports that pain level "Stays at about 6-7/10" he was given medication during OT session & was noted be Mod I-I functional mobility and transfers. Pt was educated on positioning & was repositioned in bed at conclusion of session.    ADL  Eating/Feeding: Performed;Independent Where Assessed - Eating/Feeding: Edge of bed Grooming: Simulated;Independent Where Assessed - Grooming: Unsupported standing;Other (comment) (At sink level) Upper Body Bathing: Simulated;Independent Where Assessed - Upper Body Bathing: Unsupported standing;Other (comment) (At sink) Lower Body Bathing: Simulated;Modified independent Where Assessed - Lower Body Bathing: Unsupported sitting;Unsupported sit to stand Upper Body Dressing: Simulated;Modified independent Where Assessed - Upper Body Dressing: Unsupported sitting Lower Body Dressing: Performed;Independent Where Assessed - Lower Body Dressing: Unsupported sitting;Unsupported sit to stand Toilet Transfer: Performed;Independent Toilet Transfer Method: Sit to Writer: Raised toilet seat with arms (or 3-in-1 over toilet) Toileting - Clothing Manipulation and Hygiene: Performed;Independent Where Assessed - Toileting Clothing Manipulation and Hygiene: Standing;Sit to stand from 3-in-1 or toilet Tub/Shower Transfer: Simulated;Modified independent Tub/Shower Transfer Method: Science writer: Walk in shower;Grab bars Equipment Used: Rolling walker Transfers/Ambulation Related to ADLs: Pt reports ambulating in room and hallways independently. He demonstrated I w/ functional mobility and transfers in room and bathroom this am using RW.  ADL Comments: Discussed ADL's at home related to bathing/dressing, performed toilet transfers and simulated step for shower transfer as well. Pt states that he has no further OT needs at this time. He is able to dress LB I and reports that he has grab bar in his shower at home. He plans on PRN assist from spouse at d/c.    OT Diagnosis:    OT Problem List:   OT Treatment Interventions:     OT Goals    Visit Information  Last OT Received On: 12/11/12    Subjective Data  Subjective: "I wonder how much a toilet seat would be?" per pt, re:3:1 for over commode at home Patient Stated Goal: D/c home w/ PRN wife assist   Prior Functioning     Home Living Lives With: Spouse Available Help at Discharge: Family Type of Home: House Home Access: Level entry (threshold) Home Layout: One level Bathroom Shower/Tub: Tub/shower unit;Walk-in shower;Door [employer] Toilet: Pharmacist, community: Yes How Accessible: Accessible via walker Home Adaptive Equipment: Crutches;Straight cane;Hand-held shower hose;Grab bars in shower Additional Comments: walking boot Prior Function Level of Independence: Independent Driving: Yes Vocation: On disability Communication Communication: No difficulties Dominant Hand: Right    Vision/Perception Vision - History Baseline Vision: Wears glasses only  for reading Patient Visual Report:  No change from baseline   Cognition  Cognition Overall Cognitive Status: Appears within functional limits for tasks assessed/performed Arousal/Alertness: Awake/alert Orientation Level: Oriented X4 / Intact Behavior During Session: [facility] for tasks performed    Extremity/Trunk Assessment Right Upper Extremity Assessment RUE ROM/Strength/Tone: WFL for tasks assessed RUE Coordination: WFL - gross/fine motor Left Upper Extremity Assessment LUE ROM/Strength/Tone: WFL for tasks assessed LUE Coordination: WFL - gross/fine motor     Mobility Bed Mobility Bed Mobility: Supine to Sit;Sit to Supine Supine to Sit: 7: Independent Sit to Supine: 7: Independent Details for Bed Mobility Assistance: No cues or assist needed. Transfers Sit to Stand: With upper extremity assist;From bed;6: Modified independent (Device/Increase time);From chair/3-in-1 Stand to Sit: To bed;6: Modified independent (Device/Increase time);With upper extremity assist;To chair/3-in-1 Details for Transfer Assistance: No cues or assist needed       Balance Balance Balance Assessed: Yes Static Sitting Balance Static Sitting - Balance Support: Feet supported;No upper extremity supported Static Sitting - Level of Assistance: 7: Independent Static Sitting - Comment/# of Minutes: EOB & 3:1 over toilet Static Standing Balance Static Standing - Balance Support: Bilateral upper extremity supported;During functional activity (Pt using RW) Static Standing - Level of Assistance: 7: Independent   End of Session OT - End of Session Equipment Utilized During Treatment: Other (comment) (RW) Activity Tolerance: Patient tolerated treatment well Patient left: in bed;with call bell/phone within reach  GO     Joshua Walter 12/11/2012, 8:50 AM

## 2012-12-11 NOTE — Progress Notes (Signed)
[facility]  Patient Status: Active (receiving services up to time of hospitalization)  [facility] is providing the following services: RN - active IV patient.  Noted discharge home today.  If patient discharges after hours, please call [phone].   [facility] 12/11/2012, 10:28 AM

## 2012-12-11 NOTE — Care Management Note (Signed)
    Page 1 of 2   12/11/2012     3:16:29 PM   CARE MANAGEMENT NOTE 12/11/2012  Patient:  Joshua Walter, Joshua Walter   Account Number:  [id number]  Date Initiated:  12/11/2012  Documentation initiated by:  Natalija Mavis  Subjective/Objective Assessment:   PT S/P RT FEM-TIB BYPASS GRAFT ON 12/08/12.  PTA, PT RESIDES AT HOME WITH SPOUSE AND IS INDEPENDENT.     Action/Plan:   PT REQUESTS RW FOR HOME.  INSURANCE NOT CONTRACTED WITH [facility].  PT GIVEN COPY OF ORDER FOR RW THAT HE MAY TAKE TO DME STORE IN [place] THAT HE PLANS TO GO TO.   Anticipated DC Date:  12/11/2012   Anticipated DC Plan:  HOME W HOME HEALTH SERVICES      DC Planning Services  CM consult      [facility] Choice  Resumption Of Svcs/PTA Provider   Choice offered to / List presented to:  C-1 Patient        HH arranged  HH-1 RN      [facility] agency  [facility]   Status of service:  Completed, signed off Medicare Important Message given?   (If response is "NO", the following Medicare IM given date fields will be blank) Date Medicare IM given:   Date Additional Medicare IM given:    Discharge Disposition:  HOME W HOME HEALTH SERVICES  Per UR Regulation:  Reviewed for med. necessity/level of care/duration of stay  If discussed at Long Length of Stay Meetings, dates discussed:    Comments:  12/11/12 Alitzel Cookson,RN,BSN [phone] PT ACTIVE WITH [facility] FOR IV VANCOMYCIN PRIOR TO ADMISSION, AND THEY ARE AWARE OF HIS DC TODAY.

## 2012-12-11 NOTE — Progress Notes (Signed)
Discharge instructions given to pt along with new prescription. Pt verbalized his understanding. Pt is stable for discharge with wife. Will make sure pt receives rolling walker and provide wheelchair ride to car.

## 2012-12-11 NOTE — Op Note (Signed)
[facility] [place]  Patient name: Joshua Walter MRN: [id number] DOB: 1951-09-04 Sex: male  12/08/2012 Pre-operative Diagnosis: Right heel ulcer Post-operative diagnosis:  Same Surgeon:  Jorge Ny Assistants:  Della Goo Procedure:   Right femoral to anterior tibial artery bypass with translocated ipsilateral non-reversed greater saphenous vein Anesthesia:  Gen. Blood Loss:  See anesthesia record Specimens:  None  Findings:  4-5 mm vein. Common femoral and anterior tibial artery were both good vessels ectatic femoral artery was mural thrombus which was removed.  Indications:  Patient has a chronic right heel ulcer which has failed to heal over the course of the past 9 months. Angiogram revealed an occluded superficial femoral and popliteal artery with reconstitution of his anterior tibial artery. He comes in today for revascularization.  Procedure:  The patient was identified in the holding area and taken to [facility] OR ROOM 16  The patient was then placed supine on the table. general anesthesia was administered.  The patient was prepped and draped in the usual sterile fashion.  A time out was called and antibiotics were administered.  Ultrasound was used to map the course of the saphenous vein and the right leg. This appeared to be an adequate vein with multiple branches. I initially began by making a right groin incision. Subcutaneous tissue was divided down to the femoral sheath which was opened sharply. The common femoral artery was dissected out from the inguinal ligament down to its bifurcation. The common femoral, superficial femoral, and profunda femoral artery were individually isolated and encircled with vessel loops. Next the saphenofemoral junction was dissected free. I mobilized the saphenous vein throughout this incision. Multiple incisions down the leg were made to expose the saphenous vein. There were multiple branches and the vein was very close to  the skin surface which required a somewhat difficult dissection to expose the vein. Ultimately the vein was fully mobilized. Side branches were divided between silk ties. Next a lateral leg incision was made between the tibia and fibula. The muscle bellies were separated, exposing the anterior tibial artery. This was dissected free for approximately 3 cm. The artery. A soft. Multiple venous branches in this area were ligated between silk ties. I then created a subcutaneous tunnel between the incision for the anterior tibial artery and the femoral incision. The patient was then fully heparinized. The vein was then removed, oversewing the saphenofemoral junction in 2 layers with 5-0 Prolene. The vein was prepared on the back table. It dilated nicely to about 4-5 mm. It was marked with a pen for proper orientation. After the heparin and fully circulated the femoral artery was occluded. A #11 blade was used to make an arteriotomy which was extended longitudinally with Potts scissors. The femoral artery was somewhat ectatic and there was mural thrombus which was removed under direct vision. The vein was placed in a non-reversed fashion. It was spatulated to fit the size of the arteriotomy. A end to side anastomosis was created with running 5-0 Prolene. All clamps were released. I used a valvulotome to lyse the valves. There was excellent pulsatile flow through the graft. The graft was then brought through the previously created tunnel, making sure to maintain proper orientation. I then placed a tourniquet the mid thigh. An Esmarch was used to exsanguinate the leg and the tourniquet was taken to 150 mm of pressure. Next, the anterior tibial artery was opened with #11 blade. This was extended longitudinally with Potts scissors. The vein was  cut to the appropriate length and spatulated to fit the size of the arteriotomy. A running anastomosis was created with 6-0 Prolene. Just prior to completion, the tourniquet was let  down. The artery was and graft were appropriately flushed and the anastomosis was completed. The patient had a palpable anterior tibial graft pulse as well as a palpable dorsalis pedis pulse. Protamine was administered. Once hemostasis was achieved, the wounds were copiously irrigated. The vein harvest incision was closed with 2 layers of 3-0 Vicryl. The femoral incision was closed with multiple layers of 203 0 Vicryl. The skin was closed with 4-0 Vicryl. In the anterior tibial incision, I elected not to close the fascia as this would have compressed the graft. I therefore close the subcutaneous tissue with a 3-0 Vicryl. The skin was closed with 4-0 Vicryl. I placed 3 3-0 nylon horizontal mattress sutures. The patient tolerated the procedure well and was successfully extubated and taken to recovery in stable condition. There were no complications.   Disposition:  To PACU in stable condition.   Juleen China, M.D. [facility] [place] Office: [phone] Pager:  [phone]

## 2012-12-11 NOTE — Discharge Summary (Signed)
I agree with the above  Joshua Walter 

## 2012-12-11 NOTE — Discharge Summary (Signed)
[facility] Discharge Summary  Joshua Walter April 16, 1951 62 y.o. male  [id number]  Admission Date: 12/08/2012  Discharge Date: 12/11/12  Physician: Nada Libman, MD  Admission Diagnosis: Peripheral Vascular Disease with nonhealing ulcer   HPI:   This is a 62 y.o. male who was referred for evaluation of a right heel ulcer. The patient states that he had a thorn go into his heel back in June of 2013. He used tweezers to try to get it out. He subsequently developed an ulcer. This has been debrided as well as explored. Multiple imaging studies showed no evidence of a foreign body. He was recently found to have osteomyelitis and was started on IV antibiotics. His IV antibiotics started 3 days ago. He reports no fevers or chills.  The patient states that he has diet-controlled hypertension. He has a history of smoking but quit in 2005. He also suffers from degenerative joint disease. He states that he has pain in his right foot at night which is alleviated by hanging his foot over the bed. He performs daily dressing changes.  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  Hospital Course:  The patient was admitted to the hospital and taken to the operating room on 12/08/2012 and underwent  Right femoral to anterior tibial artery bypass with translocated ipsilateral non-reversed greater saphenous vein    The pt tolerated the procedure well and was transported to the PACU in good condition. His post op course has gone well.  He has more sensation in his right foot and his right heel/ankle is slightly improved.    His home med rec shows he was taking dilaudid 4 mg every 4 hrs prn.  Pt states he was taking dilaudid 8 mg every 3 hrs.  His pain medication was changed in the hospital back to 8 mg every 3 hrs as he was getting minimal  relief with the 4 mg.  He is given a dilaudid Rx for 8 mg every 4 hrs prn pain # 30 NR.    He did have acute surgical blood loss anemia, which he is tolerating.  The remainder of the hospital course consisted of increasing mobilization and increasing intake of solids without difficulty.  CBC    Component Value Date/Time   WBC 13.1* 12/09/2012 0445   RBC 3.83* 12/09/2012 0445   HGB 11.9* 12/09/2012 0445   HCT 34.9* 12/09/2012 0445   PLT 221 12/09/2012 0445   MCV 91.1 12/09/2012 0445   MCH 31.1 12/09/2012 0445   MCHC 34.1 12/09/2012 0445   RDW 12.6 12/09/2012 0445   LYMPHSABS 2.0 12/21/2007 2125   MONOABS 0.5 12/21/2007 2125   EOSABS 0.0 12/21/2007 2125   BASOSABS 0.0 12/21/2007 2125    BMET    Component Value Date/Time   NA 138 12/09/2012 0445   K 3.8 12/09/2012 0445   CL 103 12/09/2012 0445   CO2 25 12/09/2012 0445   GLUCOSE 105* 12/09/2012 0445   BUN 7 12/09/2012 0445   CREATININE 0.74 12/09/2012 0445   CALCIUM 8.6 12/09/2012 0445   GFRNONAA >90 12/09/2012 0445   GFRAA >90 12/09/2012 0445     Discharge Instructions:   The patient is discharged to home with extensive instructions on wound care and progressive ambulation.  They are instructed not to drive or perform any heavy lifting until returning to see the physician in  his office.  Discharge Orders   Future Orders Complete By Expires     Call MD for:  redness, tenderness, or signs of infection (pain, swelling, bleeding, redness, odor or green/yellow discharge around incision site)  As directed     Call MD for:  severe or increased pain, loss or decreased feeling  in affected limb(s)  As directed     Call MD for:  temperature >100.5  As directed     Discharge wound care:  As directed     Comments:      Wash the groin wound with soap and water and pat dry.  Then put a dry gauze or washcloth there to keep this area dry.  Do not use Vaseline or neosporin on your incisions.  Only use soap and water on your incisions and then protect and keep  dry.  Keep PICC line dry.    Driving Restrictions  As directed     Comments:      No driving for 2 weeks    Lifting restrictions  As directed     Comments:      No lifting for 6 weeks    Resume previous diet  As directed        Discharge Diagnosis:  Peripheral Vascular Disease with nonhealing ulcer  Secondary Diagnosis: Patient Active Problem List  Diagnosis  . Atherosclerosis of native arteries of the extremities with ulceration(440.23)  . Peripheral vascular disease, unspecified  . Ulcer of heel and midfoot  . Occlusion and stenosis of carotid artery without mention of cerebral infarction   Past Medical History  Diagnosis Date  . Cellulitis and abscess of foot, except toes   . Pneumonia     hx  . Heart murmur   . Peripheral vascular disease     rt femoral occulsion       Medication List    TAKE these medications       aspirin 81 MG tablet  Take 81 mg by mouth daily.     HYDROmorphone 8 MG tablet  Commonly known as:  DILAUDID  Take 1 tablet (8 mg total) by mouth every 4 (four) hours as needed.     multivitamin with minerals Tabs  Take 1 tablet by mouth daily.     vancomycin 1 GM/200ML Soln  Commonly known as:  VANCOCIN  Inject 1,250 mg into the vein 2 (two) times daily. Started on 10/27/12. [facility]. Expected length of treatment is approximately 6-8 weeks.        Disposition: home.  Pt is to continue his Vancomycin as before hospitalization (this is already set up and is to be continued).  Patient's condition: is Good  Follow up: 1. Dr. Myra Gianotti in 2 weeks   Doreatha Massed, PA-C [facility] [phone] 12/11/2012  8:41 AM  /

## 2012-12-15 ENCOUNTER — Telehealth: Payer: Self-pay | Admitting: Surgery

## 2012-12-15 NOTE — Telephone Encounter (Addendum)
Message copied by Rosalyn Charters on Fri Dec 15, 2012  4:33 PM ------      Message from: Sharee Pimple      Created: Fri Dec 15, 2012  3:28 PM      Regarding: schedule                   ----- Message -----         From: Dara Lords, PA-C         Sent: 12/11/2012   8:40 AM           To: Sharee Pimple, CMA            S/p right femoral to AT bypass graft 12/08/12.  F/u with Dr. Myra Gianotti in 2 weeks.  Will probably need sutures removed.            Thanks,      Lelon Mast ------  notified patient of fu appt. 01-02-13 11:15 am with dr. Hart Rochester (because dr. Myra Gianotti is on vacation)

## 2012-12-25 ENCOUNTER — Other Ambulatory Visit (HOSPITAL_COMMUNITY): Payer: Self-pay | Admitting: Interventional Radiology

## 2012-12-25 DIAGNOSIS — M79609 Pain in unspecified limb: Secondary | ICD-10-CM

## 2012-12-25 DIAGNOSIS — M25579 Pain in unspecified ankle and joints of unspecified foot: Secondary | ICD-10-CM

## 2012-12-25 DIAGNOSIS — M869 Osteomyelitis, unspecified: Secondary | ICD-10-CM

## 2012-12-25 DIAGNOSIS — M795 Residual foreign body in soft tissue: Secondary | ICD-10-CM

## 2012-12-25 DIAGNOSIS — L97409 Non-pressure chronic ulcer of unspecified heel and midfoot with unspecified severity: Secondary | ICD-10-CM

## 2012-12-25 DIAGNOSIS — IMO0002 Reserved for concepts with insufficient information to code with codable children: Secondary | ICD-10-CM

## 2012-12-25 DIAGNOSIS — L03119 Cellulitis of unspecified part of limb: Secondary | ICD-10-CM

## 2013-01-01 ENCOUNTER — Encounter: Payer: Self-pay | Admitting: Vascular Surgery

## 2013-01-02 ENCOUNTER — Encounter: Payer: Self-pay | Admitting: Vascular Surgery

## 2013-01-02 ENCOUNTER — Ambulatory Visit (INDEPENDENT_AMBULATORY_CARE_PROVIDER_SITE_OTHER): Payer: Medicare HMO | Admitting: Vascular Surgery

## 2013-01-02 VITALS — BP 142/98 | HR 83 | Resp 16 | Ht 70.0 in | Wt 170.0 lb

## 2013-01-02 DIAGNOSIS — Z48812 Encounter for surgical aftercare following surgery on the circulatory system: Secondary | ICD-10-CM

## 2013-01-02 DIAGNOSIS — I739 Peripheral vascular disease, unspecified: Secondary | ICD-10-CM

## 2013-01-02 HISTORY — DX: Peripheral vascular disease, unspecified: I73.9

## 2013-01-02 NOTE — Progress Notes (Signed)
Subjective:     Patient ID: PABLO STAUFFER, male   DOB: 10/13/1950, 62 y.o.   MRN: [id number]  HPI this 62 year old male status post right femoral to anterior tibial bypass Dr. Myra Gianotti for nonhealing ulcer right heel. He has done well. He has had some swelling in his right leg below the knee. He states the foot feels much better with less burning in that the ulcer is healing.   Review of Systems     Objective:   Physical Exam BP 142/98  Pulse 83  Resp 16  Ht 5\' 10"  (1.778 m)  Wt 170 lb (77.111 kg)  BMI 24.39 kg/m2  Right lower extremity with well-healed incisions. 3+ pulse in the femoral to anterior tibial graft laterally. 3+ dorsalis pedis pulse palpable. 1-2+ edema from knee to foot. Ulcer on right heel is contracting and measures about 1 cm in diameter-uninfected      Assessment:     Widely patent right femoral anterior tibial saphenous vein graft with healing ulcer right heel    Plan:     Sutures removed today Patient to return in 2 months for follow up with Dr. Myra Gianotti with ABIs and scan of bypass graft Patient advised to elevate legs each night and intermittently during the

## 2013-01-02 NOTE — Addendum Note (Signed)
Addended by: Adria Dill L on: 01/02/2013 11:53 AM   Modules accepted: Orders

## 2013-03-09 ENCOUNTER — Encounter: Payer: Self-pay | Admitting: Surgery

## 2013-03-12 ENCOUNTER — Encounter (INDEPENDENT_AMBULATORY_CARE_PROVIDER_SITE_OTHER): Payer: Medicare HMO | Admitting: *Deleted

## 2013-03-12 ENCOUNTER — Encounter: Payer: Self-pay | Admitting: Surgery

## 2013-03-12 ENCOUNTER — Ambulatory Visit (INDEPENDENT_AMBULATORY_CARE_PROVIDER_SITE_OTHER): Payer: Medicare HMO | Admitting: Surgery

## 2013-03-12 VITALS — BP 154/89 | HR 67 | Ht 70.0 in | Wt 171.1 lb

## 2013-03-12 DIAGNOSIS — Z48812 Encounter for surgical aftercare following surgery on the circulatory system: Secondary | ICD-10-CM

## 2013-03-12 DIAGNOSIS — I739 Peripheral vascular disease, unspecified: Secondary | ICD-10-CM

## 2013-03-12 NOTE — Progress Notes (Signed)
[facility] and [facility]ist of [place]   Patient name: Joshua Walter MRN: [id number] DOB: 03-27-1951 Sex: male     Chief Complaint  Patient presents with  . Re-evaluation    2 month f/u     HISTORY OF PRESENT ILLNESS: The patient comes in today for followup. He underwent right femoral to anterior tibial artery bypass graft with translocated ipsilateral non-reversed greater saphenous vein on 12/12/2011. This was done as a limb salvage operation for a heel ulcer. He has minimal complaints today. He is no longer doing wound care to his heel  Past Medical History  Diagnosis Date  . Cellulitis and abscess of foot, except toes   . Pneumonia     hx  . Heart murmur   . Peripheral vascular disease     rt femoral occulsion    Past Surgical History  Procedure Laterality Date  . Spine surgery  1997    Lumbar spine  . Abdominal surgery      2009  . Cervical spine surgery  07  . Hernia repair Left 76    inguinal  . Femoral-tibial bypass graft Right 12/08/2012    Procedure: BYPASS GRAFT FEMORAL-TIBIAL ARTERY;  Surgeon: Nada Libman, MD;  Location: [facility] OR;  Service: Vascular;  Laterality: Right;  Ultrasound guided    History   Social History  . Marital Status: Married    Spouse Name: N/A    Number of Children: N/A  . Years of Education: N/A   Occupational History  . Not on file.   Social History Main Topics  . Smoking status: Former Smoker -- 1.00 packs/day for 44 years    Types: Cigarettes    Quit date: 09/20/2004  . Smokeless tobacco: Not on file     Comment: occ alcohol  . Alcohol Use: Yes  . Drug Use: No  . Sexually Active: Not on file   Other Topics Concern  . Not on file   Social History Narrative  . No narrative on file    Family History  Problem Relation Age of Onset  . Cancer Mother     Allergies as of 03/12/2013  . (No Known Allergies)    Current Outpatient Prescriptions on File Prior to Visit  Medication Sig Dispense Refill  . aspirin 81 MG  tablet Take 81 mg by mouth daily.      Marland Kitchen HYDROmorphone (DILAUDID) 8 MG tablet Take 4 mg by mouth every 4 (four) hours as needed.      . Multiple Vitamin (MULTIVITAMIN WITH MINERALS) TABS Take 1 tablet by mouth daily.      . vancomycin (VANCOCIN) 1 GM/200ML SOLN Inject 1,250 mg into the vein 2 (two) times daily. Started on 10/27/12. [facility]. Expected length of treatment is approximately 6-8 weeks.       No current facility-administered medications on file prior to visit.     REVIEW OF SYSTEMS: No changes from prior visit  PHYSICAL EXAMINATION:   Vital signs are BP 154/89  Pulse 67  Ht 5\' 10"  (1.778 m)  Wt 171 lb 1.6 oz (77.61 kg)  BMI 24.55 kg/m2  SpO2 100% General: The patient appears their stated age. HEENT:  No gross abnormalities Pulmonary:  Non labored breathing Musculoskeletal: There are no major deformities. Neurologic: No focal weakness or paresthesias are detected, Skin: Essentially healed right heel ulcer  Psychiatric: The patient has normal affect. Cardiovascular: Bypass graft which is subcutaneous is easily palpable.   Diagnostic Studies Duplex ultrasound shows a widely  patent bypass graft. ABI is 1.01 on the right.  Assessment: Status post right anterior tibial bypass graft for limb salvage Plan: The patient has had a dramatic improvement in his right heel ulcer. It has essentially healed. His bypass graft is widely patent. He will come back in 3 months for a repeat graft scan.  Jorge Ny, M.D. [facility] [place] Office: [phone] Pager:  [phone]

## 2013-03-21 ENCOUNTER — Other Ambulatory Visit: Payer: Self-pay | Admitting: *Deleted

## 2013-03-21 DIAGNOSIS — I739 Peripheral vascular disease, unspecified: Secondary | ICD-10-CM

## 2013-03-21 DIAGNOSIS — Z48812 Encounter for surgical aftercare following surgery on the circulatory system: Secondary | ICD-10-CM

## 2013-06-15 ENCOUNTER — Encounter: Payer: Self-pay | Admitting: Surgery

## 2013-06-18 ENCOUNTER — Ambulatory Visit (HOSPITAL_COMMUNITY)
Admission: RE | Admit: 2013-06-18 | Discharge: 2013-06-18 | Disposition: A | Discharge status: Home / Self Care | Payer: Medicare HMO | Source: Ambulatory Visit | Attending: Surgery | Admitting: Surgery

## 2013-06-18 ENCOUNTER — Ambulatory Visit (INDEPENDENT_AMBULATORY_CARE_PROVIDER_SITE_OTHER): Payer: Medicare HMO | Admitting: Surgery

## 2013-06-18 ENCOUNTER — Encounter: Payer: Self-pay | Admitting: Surgery

## 2013-06-18 ENCOUNTER — Ambulatory Visit (INDEPENDENT_AMBULATORY_CARE_PROVIDER_SITE_OTHER)
Admission: RE | Admit: 2013-06-18 | Discharge: 2013-06-18 | Disposition: A | Discharge status: Home / Self Care | Payer: Medicare HMO | Source: Ambulatory Visit | Attending: Surgery | Admitting: Surgery

## 2013-06-18 VITALS — BP 186/91 | HR 65 | Resp 18 | Ht 71.0 in | Wt 170.0 lb

## 2013-06-18 DIAGNOSIS — Z48812 Encounter for surgical aftercare following surgery on the circulatory system: Secondary | ICD-10-CM

## 2013-06-18 DIAGNOSIS — I739 Peripheral vascular disease, unspecified: Secondary | ICD-10-CM

## 2013-06-18 NOTE — Progress Notes (Signed)
Vascular and Vein Specialist of [place]   Patient name: Joshua Walter MRN: [id number] DOB: 10-Oct-1950 Sex: male     Chief Complaint  Patient presents with  . PVD    PVD  f/u  s/p FemTib BPG  12/08/12    HISTORY OF PRESENT ILLNESS: The patient comes in today for followup. He underwent right femoral to anterior tibial artery bypass graft with translocated ipsilateral non-reversed greater saphenous vein on 12/12/2011. This was done as a limb salvage operation for a heel ulcer. His heel ulcer has completely healed. He does have issues with swelling in his right leg. It is worse when he is on his feet for a long time. Does get better with elevation   Past Medical History  Diagnosis Date  . Cellulitis and abscess of foot, except toes   . Pneumonia     hx  . Heart murmur   . Peripheral vascular disease     rt femoral occulsion    Past Surgical History  Procedure Laterality Date  . Spine surgery  1997    Lumbar spine  . Abdominal surgery      2009  . Cervical spine surgery  07  . Hernia repair Left 76    inguinal  . Femoral-tibial bypass graft Right 12/08/2012    Procedure: BYPASS GRAFT FEMORAL-TIBIAL ARTERY;  Surgeon: Nada Libman, MD;  Location: [facility] OR;  Service: Vascular;  Laterality: Right;  Ultrasound guided    History   Social History  . Marital Status: Married    Spouse Name: N/A    Number of Children: N/A  . Years of Education: N/A   Occupational History  . Not on file.   Social History Main Topics  . Smoking status: Former Smoker -- 1.00 packs/day for 44 years    Types: Cigarettes    Quit date: 09/20/2004  . Smokeless tobacco: Never Used     Comment: occ alcohol  . Alcohol Use: Yes  . Drug Use: No  . Sexual Activity: Not on file   Other Topics Concern  . Not on file   Social History Narrative  . No narrative on file    Family History  Problem Relation Age of Onset  . Cancer Mother     Allergies as of 06/18/2013  . (No Known Allergies)     Current Outpatient Prescriptions on File Prior to Visit  Medication Sig Dispense Refill  . aspirin 81 MG tablet Take 81 mg by mouth daily.      . Multiple Vitamin (MULTIVITAMIN WITH MINERALS) TABS Take 1 tablet by mouth daily.      Marland Kitchen HYDROmorphone (DILAUDID) 8 MG tablet Take 4 mg by mouth every 4 (four) hours as needed.      . vancomycin (VANCOCIN) 1 GM/200ML SOLN Inject 1,250 mg into the vein 2 (two) times daily. Started on 10/27/12. [facility]. Expected length of treatment is approximately 6-8 weeks.       No current facility-administered medications on file prior to visit.     REVIEW OF SYSTEMS: No changes from prior visit  PHYSICAL EXAMINATION:   Vital signs are BP 186/91  Pulse 65  Resp 18  Ht 5\' 11"  (1.803 m)  Wt 170 lb (77.111 kg)  BMI 23.72 kg/m2 General: The patient appears their stated age. HEENT:  No gross abnormalities Pulmonary:  Non labored breathing Musculoskeletal: There are no major deformities. Neurologic: No focal weakness or paresthesias are detected, Skin: Completely healed ulcer on the right heel Psychiatric:  The patient has normal affect. Cardiovascular: There is a regular rate and rhythm without significant murmur appreciated. Easily palpable graft pulse   Diagnostic Studies Duplex ultrasound was ordered and reviewed. This shows a widely patent right femoral to anterior bypass graft without evidence of hyperplasia or stenosis. His ABI is 0.97 with triphasic waveforms.  Assessment: Status post right femoral to anterior tibial artery bypass graft for limb salvage Plan: The patient is doing very well. His ulcer has completely healed. His bypass graft is widely patent. He will followup in 3 months with a repeat ultrasound of his bypass  V. Charlena Cross, M.D. [facility] of [place] Office: [phone] Pager:  [phone]

## 2013-06-22 ENCOUNTER — Other Ambulatory Visit: Payer: Self-pay | Admitting: *Deleted

## 2013-06-22 DIAGNOSIS — I739 Peripheral vascular disease, unspecified: Secondary | ICD-10-CM

## 2013-06-22 DIAGNOSIS — Z48812 Encounter for surgical aftercare following surgery on the circulatory system: Secondary | ICD-10-CM

## 2013-07-26 ENCOUNTER — Other Ambulatory Visit: Payer: Self-pay

## 2013-09-10 ENCOUNTER — Telehealth: Payer: Self-pay | Admitting: Family

## 2013-09-10 NOTE — Telephone Encounter (Signed)
Recvd call from pt to cancel his appointment in January. Pt cannot afford his deductible for outpatient services, and he declined just seeing NP for follow up, dpm  He was scheduled for ABIs and LE Arterial duplex, bilateral.

## 2013-09-17 ENCOUNTER — Encounter (HOSPITAL_COMMUNITY): Payer: Medicare HMO

## 2013-09-17 ENCOUNTER — Other Ambulatory Visit (HOSPITAL_COMMUNITY): Payer: Medicare HMO

## 2013-09-17 ENCOUNTER — Ambulatory Visit: Payer: Medicare HMO | Admitting: Family

## 2013-10-26 IMAGING — US IR US GUIDE VASC ACCESS RIGHT
1 series · 4 of 4 positions shown · non-contrast
Comparison: none

INDICATION: Osteomyelitis, in need of intravenous access for
antibiotic administration

[Series 1: ir us guide vasc access right · 4 of 4 slices shown]
[im 1/4]
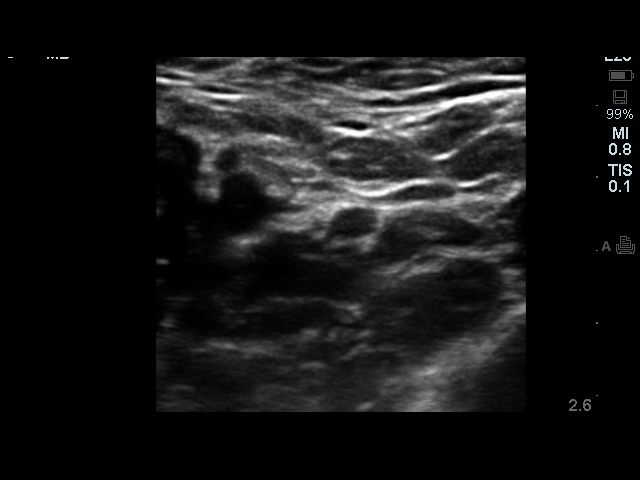
[im 2/4]
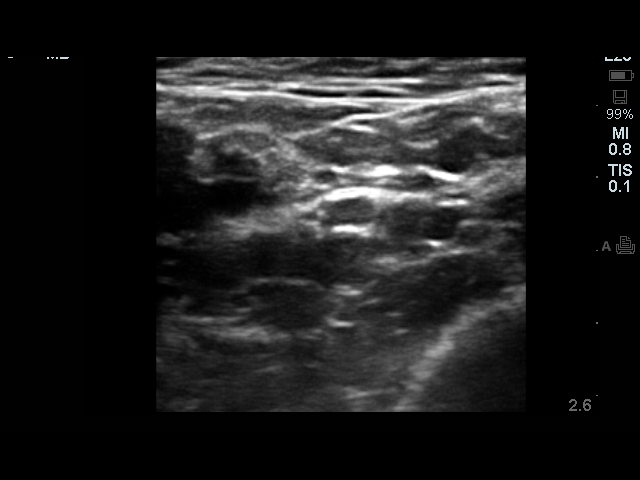
[im 3/4]
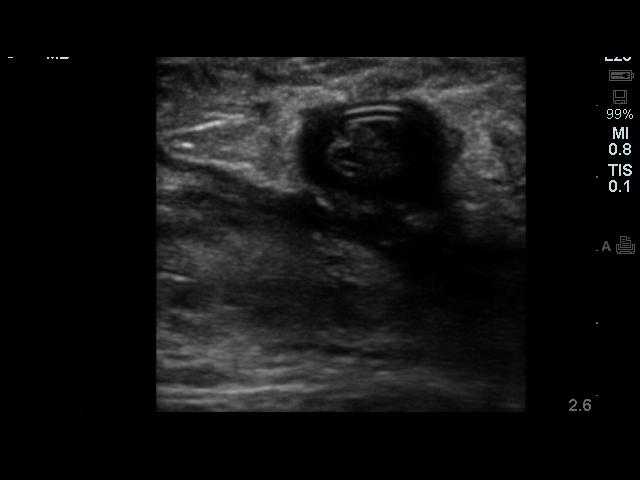
[im 4/4]
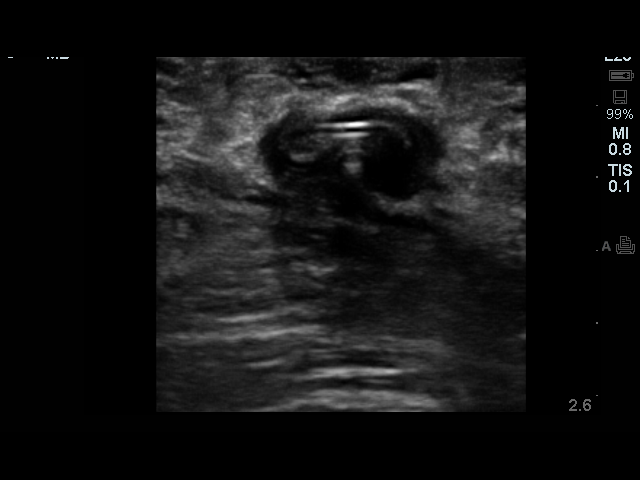

[4 of 4 positions shown; findings below may reference images not displayed]

ULTRASOUND AND FLUORSCOPIC GUIDED PICC LINE INSERTION

Intravenous Medications: None

Contrast: None

Fluoroscopy Time:  0.6 minutes.

Complications: None immediate

Technique / Findings:

The procedure, risks, benefits, and alternatives were explained to
the patient and informed written consent was obtained.  A timeout
was performed prior to the initiation of the procedure.

The right upper extremity was prepped with chlorhexidine in a
sterile fashion, and a sterile drape was applied covering the
operative field.  Maximum barrier sterile technique with sterile
gowns and gloves were used for the procedure.  A timeout was
performed prior to the initiation of the procedure.  Local
anesthesia was provided with 1% lidocaine.

Under direct ultrasound guidance, the rightbasilicvein was accessed
with a micropuncture kit after the overlying soft tissues were
anesthetized with 1% lidocaine.  An ultrasound image was saved for
documentation purposes.  A guidewire was advanced to the level of
the superior caval-atrial junction for measurement purposes and the
PICC line was cut to length.  A peel-away sheath was placed and a
44 cm, 5 French, single lumen was inserted to level of the superior
caval-atrial junction.  A post procedure spot fluoroscopic was
obtained.  The catheter easily aspirated and flushed and was
sutured in place.  A dressing was placed.  The patient tolerated
the procedure well without immediate post procedural complication.
IMPRESSION: Successful ultrasound and fluoroscopic guided placement of a right
basilic vein approach, 44 cm, 5 French,single lumen PICC with tip
at the superior caval-atrial junction.  The PICC line is ready for
immediate use.

## 2013-11-28 IMAGING — CR DG CHEST 2V
2 series · 2 of 2 positions shown · non-contrast
Comparison: 10/27/2012

CLINICAL DATA: Preadmission radiograph

CHEST - 2 VIEW

[view not recorded (1 of 2)]
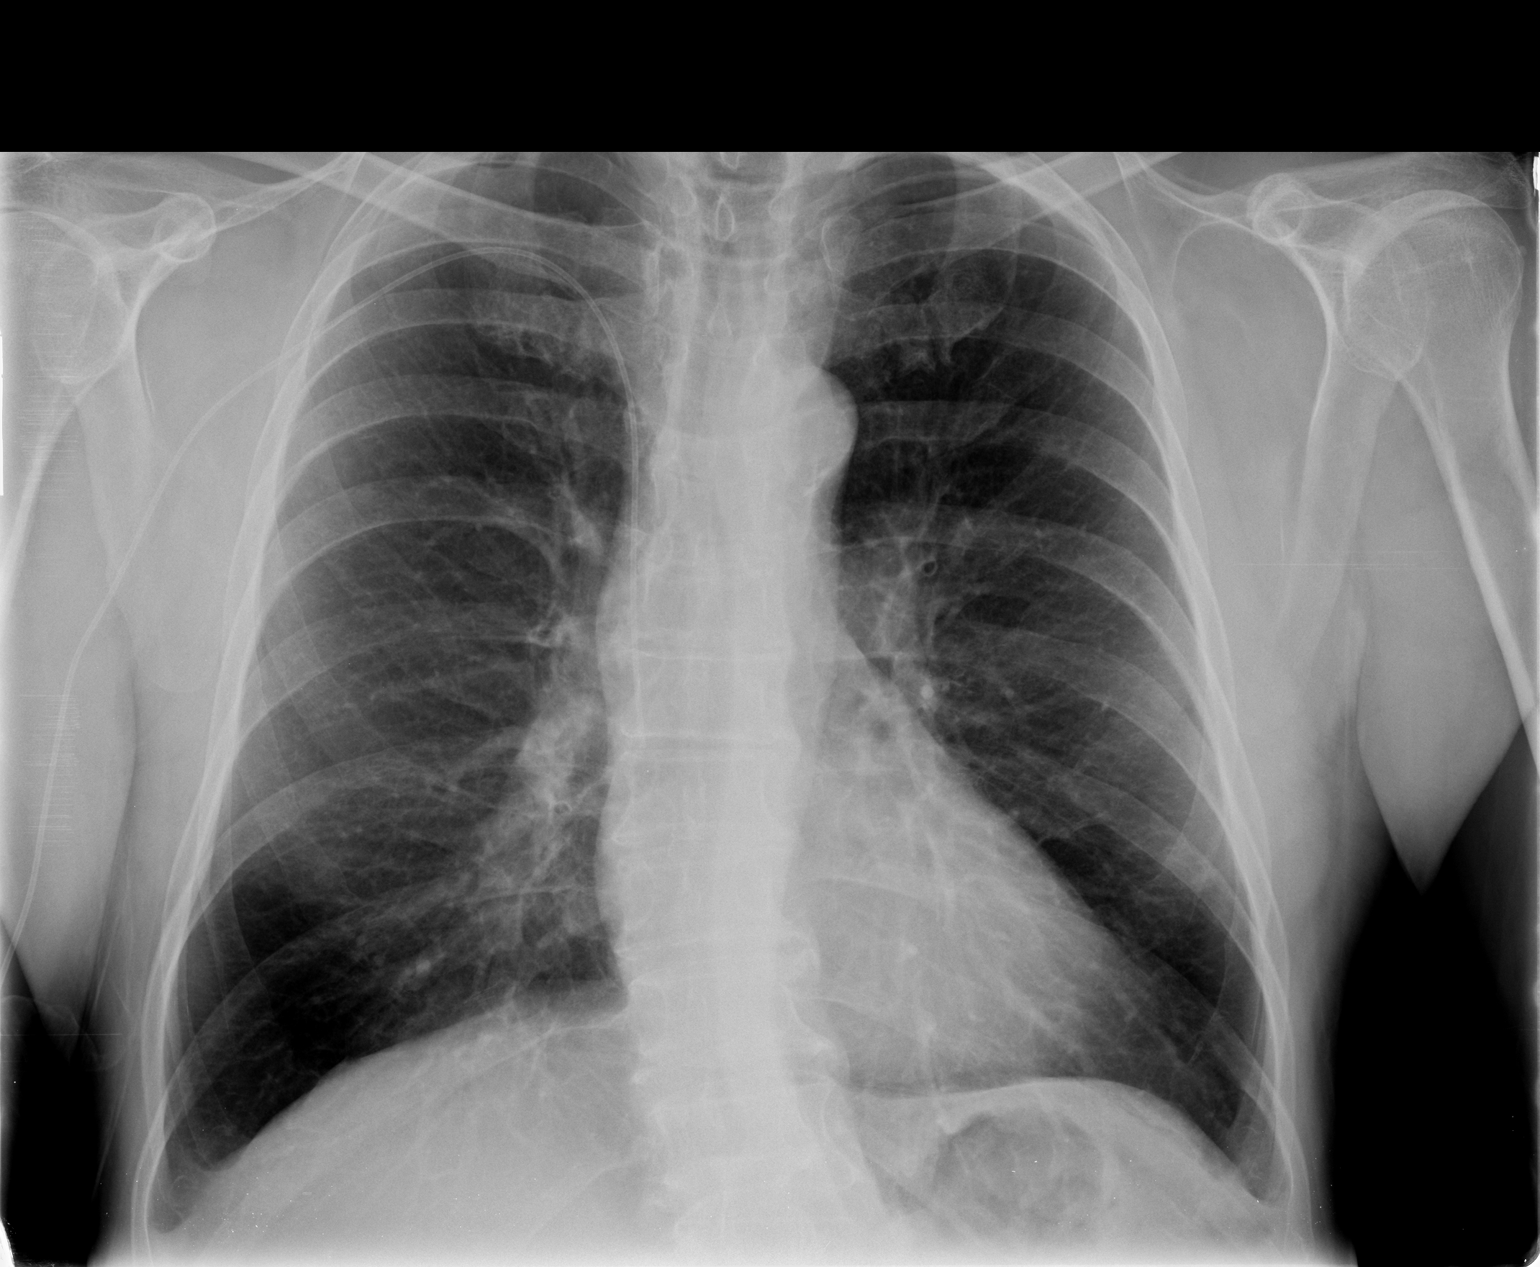

[view not recorded (2 of 2)]
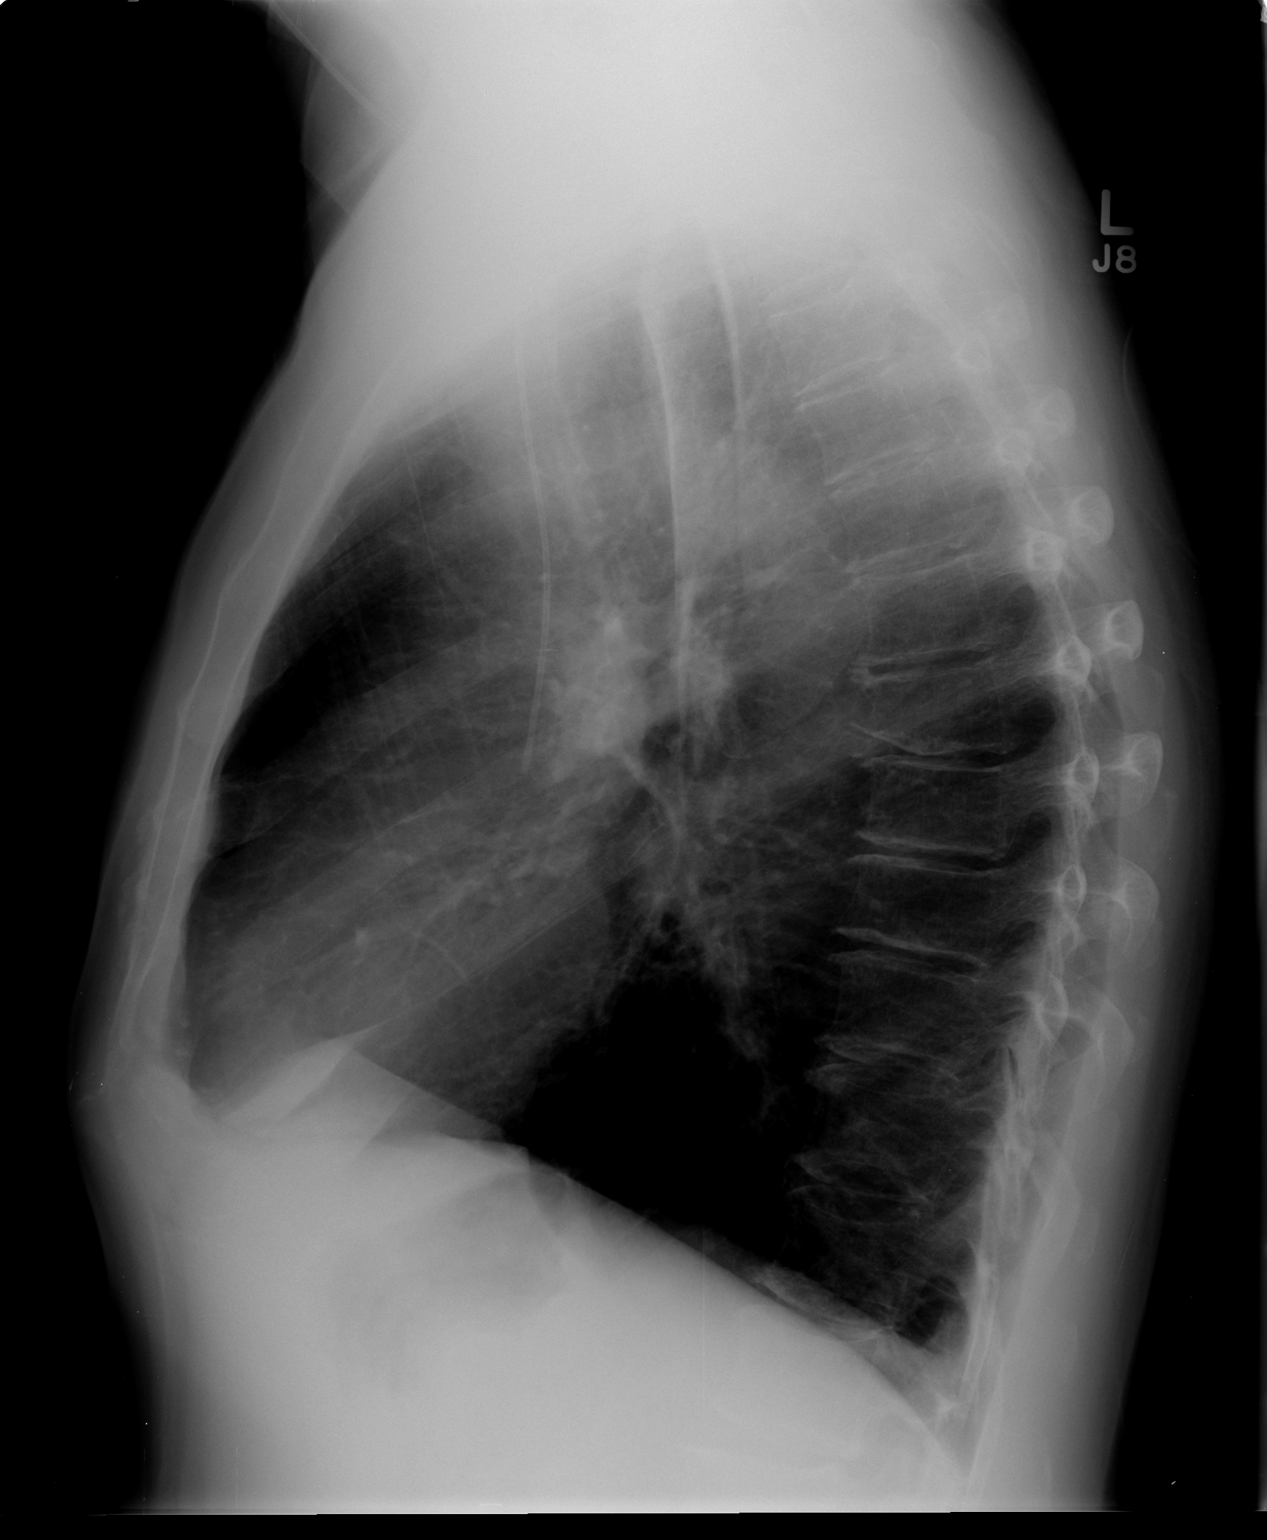

[2 of 2 positions shown; findings below may reference images not displayed]

FINDINGS: There is a right arm PICC line with tip in the cavoatrial
junction.   The heart size and mediastinal contours are within
normal limits.  Both lungs are clear.  The visualized skeletal
structures are unremarkable.
IMPRESSION: No acute findings.

## 2014-07-05 ENCOUNTER — Other Ambulatory Visit: Payer: Self-pay

## 2014-08-29 ENCOUNTER — Encounter (HOSPITAL_COMMUNITY): Payer: Self-pay | Admitting: Surgery

## 2014-11-05 DIAGNOSIS — I10 Essential (primary) hypertension: Secondary | ICD-10-CM | POA: Diagnosis not present

## 2015-02-20 DIAGNOSIS — M5012 Cervical disc disorder with radiculopathy, mid-cervical region: Secondary | ICD-10-CM | POA: Diagnosis not present

## 2015-02-20 DIAGNOSIS — M542 Cervicalgia: Secondary | ICD-10-CM | POA: Diagnosis not present

## 2015-02-20 DIAGNOSIS — M961 Postlaminectomy syndrome, not elsewhere classified: Secondary | ICD-10-CM | POA: Diagnosis not present

## 2015-07-01 DIAGNOSIS — I10 Essential (primary) hypertension: Secondary | ICD-10-CM | POA: Diagnosis not present

## 2015-07-01 DIAGNOSIS — E7800 Pure hypercholesterolemia, unspecified: Secondary | ICD-10-CM | POA: Diagnosis not present

## 2015-07-01 DIAGNOSIS — Z0000 Encounter for general adult medical examination without abnormal findings: Secondary | ICD-10-CM | POA: Diagnosis not present

## 2015-07-01 DIAGNOSIS — I779 Disorder of arteries and arterioles, unspecified: Secondary | ICD-10-CM | POA: Diagnosis not present

## 2015-07-01 DIAGNOSIS — Z1211 Encounter for screening for malignant neoplasm of colon: Secondary | ICD-10-CM | POA: Diagnosis not present

## 2015-07-01 DIAGNOSIS — Z23 Encounter for immunization: Secondary | ICD-10-CM | POA: Diagnosis not present

## 2015-07-01 DIAGNOSIS — M545 Low back pain: Secondary | ICD-10-CM | POA: Diagnosis not present

## 2015-07-07 DIAGNOSIS — Z1211 Encounter for screening for malignant neoplasm of colon: Secondary | ICD-10-CM | POA: Diagnosis not present

## 2016-01-14 DIAGNOSIS — I779 Disorder of arteries and arterioles, unspecified: Secondary | ICD-10-CM | POA: Diagnosis not present

## 2016-01-14 DIAGNOSIS — E7800 Pure hypercholesterolemia, unspecified: Secondary | ICD-10-CM | POA: Diagnosis not present

## 2016-01-14 DIAGNOSIS — K279 Peptic ulcer, site unspecified, unspecified as acute or chronic, without hemorrhage or perforation: Secondary | ICD-10-CM | POA: Diagnosis not present

## 2016-01-14 DIAGNOSIS — M545 Low back pain: Secondary | ICD-10-CM | POA: Diagnosis not present

## 2016-01-14 DIAGNOSIS — I10 Essential (primary) hypertension: Secondary | ICD-10-CM | POA: Diagnosis not present

## 2016-03-17 DIAGNOSIS — E7800 Pure hypercholesterolemia, unspecified: Secondary | ICD-10-CM | POA: Diagnosis not present

## 2016-04-26 DIAGNOSIS — H66001 Acute suppurative otitis media without spontaneous rupture of ear drum, right ear: Secondary | ICD-10-CM | POA: Diagnosis not present

## 2016-04-26 DIAGNOSIS — H9193 Unspecified hearing loss, bilateral: Secondary | ICD-10-CM | POA: Diagnosis not present

## 2016-04-26 DIAGNOSIS — H6121 Impacted cerumen, right ear: Secondary | ICD-10-CM | POA: Diagnosis not present

## 2016-04-26 DIAGNOSIS — H9203 Otalgia, bilateral: Secondary | ICD-10-CM | POA: Diagnosis not present

## 2016-04-26 DIAGNOSIS — H60503 Unspecified acute noninfective otitis externa, bilateral: Secondary | ICD-10-CM | POA: Diagnosis not present

## 2016-05-13 DIAGNOSIS — H6690 Otitis media, unspecified, unspecified ear: Secondary | ICD-10-CM | POA: Diagnosis not present

## 2016-05-13 DIAGNOSIS — H9393 Unspecified disorder of ear, bilateral: Secondary | ICD-10-CM | POA: Diagnosis not present

## 2016-07-14 DIAGNOSIS — Z1211 Encounter for screening for malignant neoplasm of colon: Secondary | ICD-10-CM | POA: Diagnosis not present

## 2016-07-14 DIAGNOSIS — I10 Essential (primary) hypertension: Secondary | ICD-10-CM | POA: Diagnosis not present

## 2016-07-14 DIAGNOSIS — Z23 Encounter for immunization: Secondary | ICD-10-CM | POA: Diagnosis not present

## 2016-07-14 DIAGNOSIS — H6090 Unspecified otitis externa, unspecified ear: Secondary | ICD-10-CM | POA: Diagnosis not present

## 2016-07-14 DIAGNOSIS — E7800 Pure hypercholesterolemia, unspecified: Secondary | ICD-10-CM | POA: Diagnosis not present

## 2016-10-28 DIAGNOSIS — Z1211 Encounter for screening for malignant neoplasm of colon: Secondary | ICD-10-CM | POA: Diagnosis not present

## 2017-02-23 DIAGNOSIS — I10 Essential (primary) hypertension: Secondary | ICD-10-CM | POA: Diagnosis not present

## 2017-02-23 DIAGNOSIS — K279 Peptic ulcer, site unspecified, unspecified as acute or chronic, without hemorrhage or perforation: Secondary | ICD-10-CM | POA: Diagnosis not present

## 2017-02-23 DIAGNOSIS — E7800 Pure hypercholesterolemia, unspecified: Secondary | ICD-10-CM | POA: Diagnosis not present

## 2017-02-23 DIAGNOSIS — M961 Postlaminectomy syndrome, not elsewhere classified: Secondary | ICD-10-CM | POA: Diagnosis not present

## 2017-02-23 DIAGNOSIS — Z0000 Encounter for general adult medical examination without abnormal findings: Secondary | ICD-10-CM | POA: Diagnosis not present

## 2017-02-23 DIAGNOSIS — I779 Disorder of arteries and arterioles, unspecified: Secondary | ICD-10-CM | POA: Diagnosis not present

## 2017-07-05 DIAGNOSIS — M961 Postlaminectomy syndrome, not elsewhere classified: Secondary | ICD-10-CM | POA: Diagnosis not present

## 2017-07-05 DIAGNOSIS — Z23 Encounter for immunization: Secondary | ICD-10-CM | POA: Diagnosis not present

## 2017-07-05 DIAGNOSIS — I10 Essential (primary) hypertension: Secondary | ICD-10-CM | POA: Diagnosis not present

## 2017-07-05 DIAGNOSIS — Z0000 Encounter for general adult medical examination without abnormal findings: Secondary | ICD-10-CM | POA: Diagnosis not present

## 2018-07-18 DIAGNOSIS — Z0000 Encounter for general adult medical examination without abnormal findings: Secondary | ICD-10-CM | POA: Diagnosis not present

## 2018-07-18 DIAGNOSIS — E7800 Pure hypercholesterolemia, unspecified: Secondary | ICD-10-CM | POA: Diagnosis not present

## 2018-07-18 DIAGNOSIS — I779 Disorder of arteries and arterioles, unspecified: Secondary | ICD-10-CM | POA: Diagnosis not present

## 2018-07-18 DIAGNOSIS — I10 Essential (primary) hypertension: Secondary | ICD-10-CM | POA: Diagnosis not present

## 2018-07-18 DIAGNOSIS — Z23 Encounter for immunization: Secondary | ICD-10-CM | POA: Diagnosis not present

## 2018-07-18 DIAGNOSIS — Z1211 Encounter for screening for malignant neoplasm of colon: Secondary | ICD-10-CM | POA: Diagnosis not present

## 2018-07-18 DIAGNOSIS — M961 Postlaminectomy syndrome, not elsewhere classified: Secondary | ICD-10-CM | POA: Diagnosis not present

## 2018-07-18 DIAGNOSIS — H9209 Otalgia, unspecified ear: Secondary | ICD-10-CM | POA: Diagnosis not present

## 2018-08-02 DIAGNOSIS — H903 Sensorineural hearing loss, bilateral: Secondary | ICD-10-CM | POA: Diagnosis not present

## 2018-08-02 DIAGNOSIS — H6060 Unspecified chronic otitis externa, unspecified ear: Secondary | ICD-10-CM | POA: Diagnosis not present

## 2019-01-18 DIAGNOSIS — E7800 Pure hypercholesterolemia, unspecified: Secondary | ICD-10-CM | POA: Diagnosis not present

## 2019-01-18 DIAGNOSIS — M961 Postlaminectomy syndrome, not elsewhere classified: Secondary | ICD-10-CM | POA: Diagnosis not present

## 2019-01-18 DIAGNOSIS — Z1211 Encounter for screening for malignant neoplasm of colon: Secondary | ICD-10-CM | POA: Diagnosis not present

## 2019-01-18 DIAGNOSIS — I779 Disorder of arteries and arterioles, unspecified: Secondary | ICD-10-CM | POA: Diagnosis not present

## 2019-01-18 DIAGNOSIS — I10 Essential (primary) hypertension: Secondary | ICD-10-CM | POA: Diagnosis not present

## 2019-04-05 DIAGNOSIS — B354 Tinea corporis: Secondary | ICD-10-CM | POA: Diagnosis not present

## 2019-05-26 DIAGNOSIS — S2243XA Multiple fractures of ribs, bilateral, initial encounter for closed fracture: Secondary | ICD-10-CM | POA: Diagnosis not present

## 2019-05-26 DIAGNOSIS — S6992XA Unspecified injury of left wrist, hand and finger(s), initial encounter: Secondary | ICD-10-CM | POA: Diagnosis not present

## 2019-05-26 DIAGNOSIS — S32302A Unspecified fracture of left ilium, initial encounter for closed fracture: Secondary | ICD-10-CM | POA: Diagnosis not present

## 2019-05-26 DIAGNOSIS — S0081XA Abrasion of other part of head, initial encounter: Secondary | ICD-10-CM | POA: Diagnosis not present

## 2019-05-26 DIAGNOSIS — S62391B Other fracture of second metacarpal bone, left hand, initial encounter for open fracture: Secondary | ICD-10-CM | POA: Diagnosis not present

## 2019-05-26 DIAGNOSIS — G8911 Acute pain due to trauma: Secondary | ICD-10-CM | POA: Diagnosis not present

## 2019-05-26 DIAGNOSIS — S62391A Other fracture of second metacarpal bone, left hand, initial encounter for closed fracture: Secondary | ICD-10-CM | POA: Diagnosis not present

## 2019-05-26 DIAGNOSIS — S32312A Displaced avulsion fracture of left ilium, initial encounter for closed fracture: Secondary | ICD-10-CM | POA: Diagnosis not present

## 2019-05-26 DIAGNOSIS — S022XXA Fracture of nasal bones, initial encounter for closed fracture: Secondary | ICD-10-CM | POA: Diagnosis not present

## 2019-05-26 DIAGNOSIS — S60512A Abrasion of left hand, initial encounter: Secondary | ICD-10-CM | POA: Diagnosis not present

## 2019-05-26 DIAGNOSIS — S66303A Unspecified injury of extensor muscle, fascia and tendon of left middle finger at wrist and hand level, initial encounter: Secondary | ICD-10-CM | POA: Diagnosis not present

## 2019-05-26 DIAGNOSIS — S22068A Other fracture of T7-T8 thoracic vertebra, initial encounter for closed fracture: Secondary | ICD-10-CM | POA: Diagnosis not present

## 2019-05-26 DIAGNOSIS — S66395A Other injury of extensor muscle, fascia and tendon of left ring finger at wrist and hand level, initial encounter: Secondary | ICD-10-CM | POA: Diagnosis not present

## 2019-05-26 DIAGNOSIS — S62311A Displaced fracture of base of second metacarpal bone. left hand, initial encounter for closed fracture: Secondary | ICD-10-CM | POA: Diagnosis not present

## 2019-05-26 DIAGNOSIS — S0990XA Unspecified injury of head, initial encounter: Secondary | ICD-10-CM | POA: Diagnosis not present

## 2019-05-26 DIAGNOSIS — Y92410 Unspecified street and highway as the place of occurrence of the external cause: Secondary | ICD-10-CM | POA: Diagnosis not present

## 2019-05-26 DIAGNOSIS — J984 Other disorders of lung: Secondary | ICD-10-CM | POA: Diagnosis not present

## 2019-05-26 DIAGNOSIS — Z01812 Encounter for preprocedural laboratory examination: Secondary | ICD-10-CM | POA: Diagnosis not present

## 2019-05-26 DIAGNOSIS — S199XXA Unspecified injury of neck, initial encounter: Secondary | ICD-10-CM | POA: Diagnosis not present

## 2019-05-26 DIAGNOSIS — S66321A Laceration of extensor muscle, fascia and tendon of left index finger at wrist and hand level, initial encounter: Secondary | ICD-10-CM | POA: Diagnosis not present

## 2019-05-26 DIAGNOSIS — D62 Acute posthemorrhagic anemia: Secondary | ICD-10-CM | POA: Diagnosis not present

## 2019-05-26 DIAGNOSIS — S22079A Unspecified fracture of T9-T10 vertebra, initial encounter for closed fracture: Secondary | ICD-10-CM | POA: Diagnosis not present

## 2019-05-26 DIAGNOSIS — S22058A Other fracture of T5-T6 vertebra, initial encounter for closed fracture: Secondary | ICD-10-CM | POA: Diagnosis not present

## 2019-05-26 DIAGNOSIS — S2232XA Fracture of one rib, left side, initial encounter for closed fracture: Secondary | ICD-10-CM | POA: Diagnosis not present

## 2019-05-26 DIAGNOSIS — T1490XA Injury, unspecified, initial encounter: Secondary | ICD-10-CM | POA: Diagnosis not present

## 2019-05-26 DIAGNOSIS — S62601B Fracture of unspecified phalanx of left index finger, initial encounter for open fracture: Secondary | ICD-10-CM | POA: Diagnosis not present

## 2019-05-26 DIAGNOSIS — S60410A Abrasion of right index finger, initial encounter: Secondary | ICD-10-CM | POA: Diagnosis not present

## 2019-05-26 DIAGNOSIS — Z01818 Encounter for other preprocedural examination: Secondary | ICD-10-CM | POA: Diagnosis not present

## 2019-05-26 DIAGNOSIS — T148XXA Other injury of unspecified body region, initial encounter: Secondary | ICD-10-CM | POA: Diagnosis not present

## 2019-05-26 DIAGNOSIS — S60511A Abrasion of right hand, initial encounter: Secondary | ICD-10-CM | POA: Diagnosis not present

## 2019-05-26 DIAGNOSIS — S069X9A Unspecified intracranial injury with loss of consciousness of unspecified duration, initial encounter: Secondary | ICD-10-CM | POA: Diagnosis not present

## 2019-05-26 DIAGNOSIS — S79912A Unspecified injury of left hip, initial encounter: Secondary | ICD-10-CM | POA: Diagnosis not present

## 2019-05-26 DIAGNOSIS — S32392A Other fracture of left ilium, initial encounter for closed fracture: Secondary | ICD-10-CM | POA: Diagnosis not present

## 2019-05-26 DIAGNOSIS — S301XXA Contusion of abdominal wall, initial encounter: Secondary | ICD-10-CM | POA: Diagnosis not present

## 2019-05-26 DIAGNOSIS — S3993XD Unspecified injury of pelvis, subsequent encounter: Secondary | ICD-10-CM | POA: Diagnosis not present

## 2019-05-26 DIAGNOSIS — S40812A Abrasion of left upper arm, initial encounter: Secondary | ICD-10-CM | POA: Diagnosis not present

## 2019-05-26 DIAGNOSIS — I10 Essential (primary) hypertension: Secondary | ICD-10-CM | POA: Diagnosis not present

## 2019-05-26 DIAGNOSIS — G8929 Other chronic pain: Secondary | ICD-10-CM | POA: Diagnosis not present

## 2019-05-26 DIAGNOSIS — S0285XA Fracture of orbit, unspecified, initial encounter for closed fracture: Secondary | ICD-10-CM | POA: Diagnosis not present

## 2019-05-26 DIAGNOSIS — S22078A Other fracture of T9-T10 vertebra, initial encounter for closed fracture: Secondary | ICD-10-CM | POA: Diagnosis not present

## 2019-05-26 DIAGNOSIS — S66323A Laceration of extensor muscle, fascia and tendon of left middle finger at wrist and hand level, initial encounter: Secondary | ICD-10-CM | POA: Diagnosis not present

## 2019-05-26 DIAGNOSIS — S52042A Displaced fracture of coronoid process of left ulna, initial encounter for closed fracture: Secondary | ICD-10-CM | POA: Diagnosis not present

## 2019-05-26 DIAGNOSIS — S62252B Displaced fracture of neck of first metacarpal bone, left hand, initial encounter for open fracture: Secondary | ICD-10-CM | POA: Diagnosis not present

## 2019-05-26 DIAGNOSIS — S22039A Unspecified fracture of third thoracic vertebra, initial encounter for closed fracture: Secondary | ICD-10-CM | POA: Diagnosis not present

## 2019-05-26 DIAGNOSIS — S60222A Contusion of left hand, initial encounter: Secondary | ICD-10-CM | POA: Diagnosis not present

## 2019-05-26 DIAGNOSIS — S0093XA Contusion of unspecified part of head, initial encounter: Secondary | ICD-10-CM | POA: Diagnosis not present

## 2019-05-26 DIAGNOSIS — S62301B Unspecified fracture of second metacarpal bone, left hand, initial encounter for open fracture: Secondary | ICD-10-CM | POA: Diagnosis not present

## 2019-05-26 DIAGNOSIS — S40811A Abrasion of right upper arm, initial encounter: Secondary | ICD-10-CM | POA: Diagnosis not present

## 2019-05-26 DIAGNOSIS — S22069A Unspecified fracture of T7-T8 vertebra, initial encounter for closed fracture: Secondary | ICD-10-CM | POA: Diagnosis not present

## 2019-05-26 DIAGNOSIS — J189 Pneumonia, unspecified organism: Secondary | ICD-10-CM | POA: Diagnosis not present

## 2019-05-26 DIAGNOSIS — R0782 Intercostal pain: Secondary | ICD-10-CM | POA: Diagnosis not present

## 2019-05-26 DIAGNOSIS — S62601A Fracture of unspecified phalanx of left index finger, initial encounter for closed fracture: Secondary | ICD-10-CM | POA: Diagnosis not present

## 2019-05-26 DIAGNOSIS — S42102A Fracture of unspecified part of scapula, left shoulder, initial encounter for closed fracture: Secondary | ICD-10-CM | POA: Diagnosis not present

## 2019-05-26 DIAGNOSIS — S22059A Unspecified fracture of T5-T6 vertebra, initial encounter for closed fracture: Secondary | ICD-10-CM | POA: Diagnosis not present

## 2019-05-26 DIAGNOSIS — J90 Pleural effusion, not elsewhere classified: Secondary | ICD-10-CM | POA: Diagnosis not present

## 2019-05-26 DIAGNOSIS — M549 Dorsalgia, unspecified: Secondary | ICD-10-CM | POA: Diagnosis not present

## 2019-05-26 DIAGNOSIS — S62331B Displaced fracture of neck of second metacarpal bone, left hand, initial encounter for open fracture: Secondary | ICD-10-CM | POA: Diagnosis not present

## 2019-05-26 DIAGNOSIS — S62331A Displaced fracture of neck of second metacarpal bone, left hand, initial encounter for closed fracture: Secondary | ICD-10-CM | POA: Diagnosis not present

## 2019-05-26 DIAGNOSIS — Y999 Unspecified external cause status: Secondary | ICD-10-CM | POA: Diagnosis not present

## 2019-05-26 DIAGNOSIS — I6501 Occlusion and stenosis of right vertebral artery: Secondary | ICD-10-CM | POA: Diagnosis not present

## 2019-05-26 DIAGNOSIS — Z20828 Contact with and (suspected) exposure to other viral communicable diseases: Secondary | ICD-10-CM | POA: Diagnosis not present

## 2019-05-27 DIAGNOSIS — S32392A Other fracture of left ilium, initial encounter for closed fracture: Secondary | ICD-10-CM | POA: Diagnosis not present

## 2019-05-27 DIAGNOSIS — S199XXA Unspecified injury of neck, initial encounter: Secondary | ICD-10-CM | POA: Diagnosis not present

## 2019-05-27 DIAGNOSIS — I6501 Occlusion and stenosis of right vertebral artery: Secondary | ICD-10-CM | POA: Diagnosis not present

## 2019-05-27 DIAGNOSIS — S42102A Fracture of unspecified part of scapula, left shoulder, initial encounter for closed fracture: Secondary | ICD-10-CM | POA: Diagnosis not present

## 2019-05-27 DIAGNOSIS — S62601A Fracture of unspecified phalanx of left index finger, initial encounter for closed fracture: Secondary | ICD-10-CM | POA: Diagnosis not present

## 2019-05-27 DIAGNOSIS — S2243XA Multiple fractures of ribs, bilateral, initial encounter for closed fracture: Secondary | ICD-10-CM | POA: Diagnosis not present

## 2019-05-28 DIAGNOSIS — S22068A Other fracture of T7-T8 thoracic vertebra, initial encounter for closed fracture: Secondary | ICD-10-CM | POA: Diagnosis not present

## 2019-05-28 DIAGNOSIS — S22058A Other fracture of T5-T6 vertebra, initial encounter for closed fracture: Secondary | ICD-10-CM | POA: Diagnosis not present

## 2019-05-28 DIAGNOSIS — T1490XA Injury, unspecified, initial encounter: Secondary | ICD-10-CM | POA: Diagnosis not present

## 2019-05-28 DIAGNOSIS — S22078A Other fracture of T9-T10 vertebra, initial encounter for closed fracture: Secondary | ICD-10-CM | POA: Diagnosis not present

## 2019-05-29 DIAGNOSIS — G8929 Other chronic pain: Secondary | ICD-10-CM | POA: Diagnosis not present

## 2019-05-29 DIAGNOSIS — J984 Other disorders of lung: Secondary | ICD-10-CM | POA: Diagnosis not present

## 2019-05-29 DIAGNOSIS — S62391A Other fracture of second metacarpal bone, left hand, initial encounter for closed fracture: Secondary | ICD-10-CM | POA: Diagnosis not present

## 2019-05-29 DIAGNOSIS — S22079A Unspecified fracture of T9-T10 vertebra, initial encounter for closed fracture: Secondary | ICD-10-CM | POA: Diagnosis not present

## 2019-05-29 DIAGNOSIS — M549 Dorsalgia, unspecified: Secondary | ICD-10-CM | POA: Diagnosis not present

## 2019-05-29 DIAGNOSIS — D62 Acute posthemorrhagic anemia: Secondary | ICD-10-CM | POA: Diagnosis not present

## 2019-05-29 DIAGNOSIS — S2232XA Fracture of one rib, left side, initial encounter for closed fracture: Secondary | ICD-10-CM | POA: Diagnosis not present

## 2019-05-29 DIAGNOSIS — S2243XA Multiple fractures of ribs, bilateral, initial encounter for closed fracture: Secondary | ICD-10-CM | POA: Diagnosis not present

## 2019-05-29 DIAGNOSIS — S22059A Unspecified fracture of T5-T6 vertebra, initial encounter for closed fracture: Secondary | ICD-10-CM | POA: Diagnosis not present

## 2019-05-29 DIAGNOSIS — S0081XA Abrasion of other part of head, initial encounter: Secondary | ICD-10-CM | POA: Diagnosis not present

## 2019-05-29 DIAGNOSIS — S22069A Unspecified fracture of T7-T8 vertebra, initial encounter for closed fracture: Secondary | ICD-10-CM | POA: Diagnosis not present

## 2019-05-29 DIAGNOSIS — I10 Essential (primary) hypertension: Secondary | ICD-10-CM | POA: Diagnosis not present

## 2019-05-30 DIAGNOSIS — I10 Essential (primary) hypertension: Secondary | ICD-10-CM | POA: Diagnosis not present

## 2019-05-30 DIAGNOSIS — S2243XA Multiple fractures of ribs, bilateral, initial encounter for closed fracture: Secondary | ICD-10-CM | POA: Diagnosis not present

## 2019-05-30 DIAGNOSIS — G8929 Other chronic pain: Secondary | ICD-10-CM | POA: Diagnosis not present

## 2019-05-30 DIAGNOSIS — J984 Other disorders of lung: Secondary | ICD-10-CM | POA: Diagnosis not present

## 2019-05-30 DIAGNOSIS — G8911 Acute pain due to trauma: Secondary | ICD-10-CM | POA: Diagnosis not present

## 2019-05-30 DIAGNOSIS — D62 Acute posthemorrhagic anemia: Secondary | ICD-10-CM | POA: Diagnosis not present

## 2019-05-31 DIAGNOSIS — J984 Other disorders of lung: Secondary | ICD-10-CM | POA: Diagnosis not present

## 2019-05-31 DIAGNOSIS — I10 Essential (primary) hypertension: Secondary | ICD-10-CM | POA: Diagnosis not present

## 2019-05-31 DIAGNOSIS — D62 Acute posthemorrhagic anemia: Secondary | ICD-10-CM | POA: Diagnosis not present

## 2019-05-31 DIAGNOSIS — S2243XA Multiple fractures of ribs, bilateral, initial encounter for closed fracture: Secondary | ICD-10-CM | POA: Diagnosis not present

## 2019-06-01 DIAGNOSIS — T148XXA Other injury of unspecified body region, initial encounter: Secondary | ICD-10-CM | POA: Diagnosis not present

## 2019-06-01 DIAGNOSIS — S2243XA Multiple fractures of ribs, bilateral, initial encounter for closed fracture: Secondary | ICD-10-CM | POA: Diagnosis not present

## 2019-06-01 DIAGNOSIS — G8911 Acute pain due to trauma: Secondary | ICD-10-CM | POA: Diagnosis not present

## 2019-06-01 DIAGNOSIS — G8929 Other chronic pain: Secondary | ICD-10-CM | POA: Diagnosis not present

## 2019-06-01 DIAGNOSIS — J984 Other disorders of lung: Secondary | ICD-10-CM | POA: Diagnosis not present

## 2019-06-01 DIAGNOSIS — I10 Essential (primary) hypertension: Secondary | ICD-10-CM | POA: Diagnosis not present

## 2019-06-01 DIAGNOSIS — T1490XA Injury, unspecified, initial encounter: Secondary | ICD-10-CM | POA: Diagnosis not present

## 2019-06-01 DIAGNOSIS — S32302A Unspecified fracture of left ilium, initial encounter for closed fracture: Secondary | ICD-10-CM | POA: Diagnosis not present

## 2019-06-01 DIAGNOSIS — D62 Acute posthemorrhagic anemia: Secondary | ICD-10-CM | POA: Diagnosis not present

## 2019-06-02 DIAGNOSIS — S62601A Fracture of unspecified phalanx of left index finger, initial encounter for closed fracture: Secondary | ICD-10-CM | POA: Diagnosis not present

## 2019-06-02 DIAGNOSIS — S42102A Fracture of unspecified part of scapula, left shoulder, initial encounter for closed fracture: Secondary | ICD-10-CM | POA: Diagnosis not present

## 2019-06-02 DIAGNOSIS — S32392A Other fracture of left ilium, initial encounter for closed fracture: Secondary | ICD-10-CM | POA: Diagnosis not present

## 2019-06-02 DIAGNOSIS — I10 Essential (primary) hypertension: Secondary | ICD-10-CM | POA: Diagnosis not present

## 2019-06-02 DIAGNOSIS — S2243XA Multiple fractures of ribs, bilateral, initial encounter for closed fracture: Secondary | ICD-10-CM | POA: Diagnosis not present

## 2019-06-02 DIAGNOSIS — G8929 Other chronic pain: Secondary | ICD-10-CM | POA: Diagnosis not present

## 2019-06-02 DIAGNOSIS — J984 Other disorders of lung: Secondary | ICD-10-CM | POA: Diagnosis not present

## 2019-06-02 DIAGNOSIS — G8911 Acute pain due to trauma: Secondary | ICD-10-CM | POA: Diagnosis not present

## 2019-06-02 DIAGNOSIS — D62 Acute posthemorrhagic anemia: Secondary | ICD-10-CM | POA: Diagnosis not present

## 2019-06-03 DIAGNOSIS — G8911 Acute pain due to trauma: Secondary | ICD-10-CM | POA: Diagnosis not present

## 2019-06-03 DIAGNOSIS — D62 Acute posthemorrhagic anemia: Secondary | ICD-10-CM | POA: Diagnosis not present

## 2019-06-03 DIAGNOSIS — S2243XA Multiple fractures of ribs, bilateral, initial encounter for closed fracture: Secondary | ICD-10-CM | POA: Diagnosis not present

## 2019-06-03 DIAGNOSIS — G8929 Other chronic pain: Secondary | ICD-10-CM | POA: Diagnosis not present

## 2019-06-03 DIAGNOSIS — J984 Other disorders of lung: Secondary | ICD-10-CM | POA: Diagnosis not present

## 2019-06-03 DIAGNOSIS — I10 Essential (primary) hypertension: Secondary | ICD-10-CM | POA: Diagnosis not present

## 2019-06-04 DIAGNOSIS — S42102A Fracture of unspecified part of scapula, left shoulder, initial encounter for closed fracture: Secondary | ICD-10-CM | POA: Diagnosis not present

## 2019-06-04 DIAGNOSIS — S32392A Other fracture of left ilium, initial encounter for closed fracture: Secondary | ICD-10-CM | POA: Diagnosis not present

## 2019-06-04 DIAGNOSIS — S62601A Fracture of unspecified phalanx of left index finger, initial encounter for closed fracture: Secondary | ICD-10-CM | POA: Diagnosis not present

## 2019-06-04 DIAGNOSIS — J984 Other disorders of lung: Secondary | ICD-10-CM | POA: Diagnosis not present

## 2019-06-04 DIAGNOSIS — S2243XA Multiple fractures of ribs, bilateral, initial encounter for closed fracture: Secondary | ICD-10-CM | POA: Diagnosis not present

## 2019-06-04 DIAGNOSIS — I10 Essential (primary) hypertension: Secondary | ICD-10-CM | POA: Diagnosis not present

## 2019-06-04 DIAGNOSIS — D62 Acute posthemorrhagic anemia: Secondary | ICD-10-CM | POA: Diagnosis not present

## 2019-06-05 DIAGNOSIS — I10 Essential (primary) hypertension: Secondary | ICD-10-CM | POA: Diagnosis not present

## 2019-06-05 DIAGNOSIS — J984 Other disorders of lung: Secondary | ICD-10-CM | POA: Diagnosis not present

## 2019-06-05 DIAGNOSIS — D62 Acute posthemorrhagic anemia: Secondary | ICD-10-CM | POA: Diagnosis not present

## 2019-06-05 DIAGNOSIS — S32392A Other fracture of left ilium, initial encounter for closed fracture: Secondary | ICD-10-CM | POA: Diagnosis not present

## 2019-06-05 DIAGNOSIS — S42102A Fracture of unspecified part of scapula, left shoulder, initial encounter for closed fracture: Secondary | ICD-10-CM | POA: Diagnosis not present

## 2019-06-05 DIAGNOSIS — S2243XA Multiple fractures of ribs, bilateral, initial encounter for closed fracture: Secondary | ICD-10-CM | POA: Diagnosis not present

## 2019-06-05 DIAGNOSIS — S62601A Fracture of unspecified phalanx of left index finger, initial encounter for closed fracture: Secondary | ICD-10-CM | POA: Diagnosis not present

## 2019-06-07 DIAGNOSIS — S62331D Displaced fracture of neck of second metacarpal bone, left hand, subsequent encounter for fracture with routine healing: Secondary | ICD-10-CM | POA: Diagnosis not present

## 2019-06-07 DIAGNOSIS — M19042 Primary osteoarthritis, left hand: Secondary | ICD-10-CM | POA: Diagnosis not present

## 2019-06-15 DIAGNOSIS — Z9889 Other specified postprocedural states: Secondary | ICD-10-CM | POA: Diagnosis not present

## 2019-06-15 DIAGNOSIS — Z4789 Encounter for other orthopedic aftercare: Secondary | ICD-10-CM | POA: Diagnosis not present

## 2019-06-19 DIAGNOSIS — Z23 Encounter for immunization: Secondary | ICD-10-CM | POA: Diagnosis not present

## 2019-06-20 DIAGNOSIS — M4184 Other forms of scoliosis, thoracic region: Secondary | ICD-10-CM | POA: Diagnosis not present

## 2019-06-20 DIAGNOSIS — S22079D Unspecified fracture of T9-T10 vertebra, subsequent encounter for fracture with routine healing: Secondary | ICD-10-CM | POA: Diagnosis not present

## 2019-06-20 DIAGNOSIS — S42102D Fracture of unspecified part of scapula, left shoulder, subsequent encounter for fracture with routine healing: Secondary | ICD-10-CM | POA: Diagnosis not present

## 2019-06-20 DIAGNOSIS — M8588 Other specified disorders of bone density and structure, other site: Secondary | ICD-10-CM | POA: Diagnosis not present

## 2019-06-20 DIAGNOSIS — S2249XD Multiple fractures of ribs, unspecified side, subsequent encounter for fracture with routine healing: Secondary | ICD-10-CM | POA: Diagnosis not present

## 2019-06-20 DIAGNOSIS — S22069D Unspecified fracture of T7-T8 vertebra, subsequent encounter for fracture with routine healing: Secondary | ICD-10-CM | POA: Diagnosis not present

## 2019-06-20 DIAGNOSIS — S22061A Stable burst fracture of T7-T8 vertebra, initial encounter for closed fracture: Secondary | ICD-10-CM | POA: Diagnosis not present

## 2019-06-20 DIAGNOSIS — M5134 Other intervertebral disc degeneration, thoracic region: Secondary | ICD-10-CM | POA: Diagnosis not present

## 2019-06-20 DIAGNOSIS — S22059D Unspecified fracture of T5-T6 vertebra, subsequent encounter for fracture with routine healing: Secondary | ICD-10-CM | POA: Diagnosis not present

## 2019-06-20 DIAGNOSIS — S22071A Stable burst fracture of T9-T10 vertebra, initial encounter for closed fracture: Secondary | ICD-10-CM | POA: Diagnosis not present

## 2019-06-20 DIAGNOSIS — S22051A Stable burst fracture of T5-T6 vertebra, initial encounter for closed fracture: Secondary | ICD-10-CM | POA: Diagnosis not present

## 2019-06-20 DIAGNOSIS — S2242XD Multiple fractures of ribs, left side, subsequent encounter for fracture with routine healing: Secondary | ICD-10-CM | POA: Diagnosis not present

## 2019-06-20 DIAGNOSIS — Z981 Arthrodesis status: Secondary | ICD-10-CM | POA: Diagnosis not present

## 2019-06-26 DIAGNOSIS — M1812 Unilateral primary osteoarthritis of first carpometacarpal joint, left hand: Secondary | ICD-10-CM | POA: Diagnosis not present

## 2019-06-26 DIAGNOSIS — Z9889 Other specified postprocedural states: Secondary | ICD-10-CM | POA: Diagnosis not present

## 2019-06-26 DIAGNOSIS — S62331D Displaced fracture of neck of second metacarpal bone, left hand, subsequent encounter for fracture with routine healing: Secondary | ICD-10-CM | POA: Diagnosis not present

## 2019-07-01 DIAGNOSIS — S32302A Unspecified fracture of left ilium, initial encounter for closed fracture: Secondary | ICD-10-CM | POA: Diagnosis not present

## 2019-07-01 DIAGNOSIS — T148XXA Other injury of unspecified body region, initial encounter: Secondary | ICD-10-CM | POA: Diagnosis not present

## 2019-07-01 DIAGNOSIS — S2243XA Multiple fractures of ribs, bilateral, initial encounter for closed fracture: Secondary | ICD-10-CM | POA: Diagnosis not present

## 2019-07-01 DIAGNOSIS — T1490XA Injury, unspecified, initial encounter: Secondary | ICD-10-CM | POA: Diagnosis not present

## 2019-07-05 DIAGNOSIS — R7989 Other specified abnormal findings of blood chemistry: Secondary | ICD-10-CM | POA: Diagnosis not present

## 2019-07-05 DIAGNOSIS — S62331D Displaced fracture of neck of second metacarpal bone, left hand, subsequent encounter for fracture with routine healing: Secondary | ICD-10-CM | POA: Diagnosis not present

## 2019-07-12 DIAGNOSIS — S62331G Displaced fracture of neck of second metacarpal bone, left hand, subsequent encounter for fracture with delayed healing: Secondary | ICD-10-CM | POA: Diagnosis not present

## 2019-07-17 DIAGNOSIS — M19042 Primary osteoarthritis, left hand: Secondary | ICD-10-CM | POA: Diagnosis not present

## 2019-07-17 DIAGNOSIS — S62391D Other fracture of second metacarpal bone, left hand, subsequent encounter for fracture with routine healing: Secondary | ICD-10-CM | POA: Diagnosis not present

## 2019-07-17 DIAGNOSIS — M1812 Unilateral primary osteoarthritis of first carpometacarpal joint, left hand: Secondary | ICD-10-CM | POA: Diagnosis not present

## 2019-07-17 DIAGNOSIS — S62311D Displaced fracture of base of second metacarpal bone. left hand, subsequent encounter for fracture with routine healing: Secondary | ICD-10-CM | POA: Diagnosis not present

## 2019-07-20 DIAGNOSIS — M8588 Other specified disorders of bone density and structure, other site: Secondary | ICD-10-CM | POA: Diagnosis not present

## 2019-07-20 DIAGNOSIS — S32392D Other fracture of left ilium, subsequent encounter for fracture with routine healing: Secondary | ICD-10-CM | POA: Diagnosis not present

## 2019-07-20 DIAGNOSIS — R269 Unspecified abnormalities of gait and mobility: Secondary | ICD-10-CM | POA: Diagnosis not present

## 2019-07-20 DIAGNOSIS — S42132D Displaced fracture of coracoid process, left shoulder, subsequent encounter for fracture with routine healing: Secondary | ICD-10-CM | POA: Diagnosis not present

## 2019-07-20 DIAGNOSIS — Z981 Arthrodesis status: Secondary | ICD-10-CM | POA: Diagnosis not present

## 2019-07-20 DIAGNOSIS — M19012 Primary osteoarthritis, left shoulder: Secondary | ICD-10-CM | POA: Diagnosis not present

## 2019-07-20 DIAGNOSIS — M160 Bilateral primary osteoarthritis of hip: Secondary | ICD-10-CM | POA: Diagnosis not present

## 2019-07-20 DIAGNOSIS — S32302D Unspecified fracture of left ilium, subsequent encounter for fracture with routine healing: Secondary | ICD-10-CM | POA: Diagnosis not present

## 2019-07-20 DIAGNOSIS — M85812 Other specified disorders of bone density and structure, left shoulder: Secondary | ICD-10-CM | POA: Diagnosis not present

## 2019-07-23 DIAGNOSIS — I700 Atherosclerosis of aorta: Secondary | ICD-10-CM | POA: Diagnosis not present

## 2019-07-23 DIAGNOSIS — Z981 Arthrodesis status: Secondary | ICD-10-CM | POA: Diagnosis not present

## 2019-07-23 DIAGNOSIS — S22079A Unspecified fracture of T9-T10 vertebra, initial encounter for closed fracture: Secondary | ICD-10-CM | POA: Diagnosis not present

## 2019-07-23 DIAGNOSIS — S22059A Unspecified fracture of T5-T6 vertebra, initial encounter for closed fracture: Secondary | ICD-10-CM | POA: Diagnosis not present

## 2019-07-23 DIAGNOSIS — M4316 Spondylolisthesis, lumbar region: Secondary | ICD-10-CM | POA: Diagnosis not present

## 2019-07-23 DIAGNOSIS — S22069A Unspecified fracture of T7-T8 vertebra, initial encounter for closed fracture: Secondary | ICD-10-CM | POA: Diagnosis not present

## 2019-07-23 DIAGNOSIS — S22008D Other fracture of unspecified thoracic vertebra, subsequent encounter for fracture with routine healing: Secondary | ICD-10-CM | POA: Diagnosis not present

## 2019-07-23 DIAGNOSIS — M47812 Spondylosis without myelopathy or radiculopathy, cervical region: Secondary | ICD-10-CM | POA: Diagnosis not present

## 2019-07-23 DIAGNOSIS — M4185 Other forms of scoliosis, thoracolumbar region: Secondary | ICD-10-CM | POA: Diagnosis not present

## 2019-07-23 DIAGNOSIS — M4312 Spondylolisthesis, cervical region: Secondary | ICD-10-CM | POA: Diagnosis not present

## 2019-07-23 DIAGNOSIS — M8588 Other specified disorders of bone density and structure, other site: Secondary | ICD-10-CM | POA: Diagnosis not present

## 2019-08-01 DIAGNOSIS — M25642 Stiffness of left hand, not elsewhere classified: Secondary | ICD-10-CM | POA: Diagnosis not present

## 2019-08-01 DIAGNOSIS — T1490XA Injury, unspecified, initial encounter: Secondary | ICD-10-CM | POA: Diagnosis not present

## 2019-08-01 DIAGNOSIS — R531 Weakness: Secondary | ICD-10-CM | POA: Diagnosis not present

## 2019-08-01 DIAGNOSIS — S32302A Unspecified fracture of left ilium, initial encounter for closed fracture: Secondary | ICD-10-CM | POA: Diagnosis not present

## 2019-08-01 DIAGNOSIS — S62331D Displaced fracture of neck of second metacarpal bone, left hand, subsequent encounter for fracture with routine healing: Secondary | ICD-10-CM | POA: Diagnosis not present

## 2019-08-01 DIAGNOSIS — S2243XA Multiple fractures of ribs, bilateral, initial encounter for closed fracture: Secondary | ICD-10-CM | POA: Diagnosis not present

## 2019-08-01 DIAGNOSIS — M79642 Pain in left hand: Secondary | ICD-10-CM | POA: Diagnosis not present

## 2019-08-06 DIAGNOSIS — M79642 Pain in left hand: Secondary | ICD-10-CM | POA: Diagnosis not present

## 2019-08-06 DIAGNOSIS — M25642 Stiffness of left hand, not elsewhere classified: Secondary | ICD-10-CM | POA: Diagnosis not present

## 2019-08-06 DIAGNOSIS — S62331D Displaced fracture of neck of second metacarpal bone, left hand, subsequent encounter for fracture with routine healing: Secondary | ICD-10-CM | POA: Diagnosis not present

## 2019-08-06 DIAGNOSIS — R531 Weakness: Secondary | ICD-10-CM | POA: Diagnosis not present

## 2019-08-07 DIAGNOSIS — Z8781 Personal history of (healed) traumatic fracture: Secondary | ICD-10-CM | POA: Diagnosis not present

## 2019-08-07 DIAGNOSIS — Z9181 History of falling: Secondary | ICD-10-CM | POA: Diagnosis not present

## 2019-08-07 DIAGNOSIS — M859 Disorder of bone density and structure, unspecified: Secondary | ICD-10-CM | POA: Diagnosis not present

## 2019-08-07 DIAGNOSIS — M8589 Other specified disorders of bone density and structure, multiple sites: Secondary | ICD-10-CM | POA: Diagnosis not present

## 2019-08-07 DIAGNOSIS — Z13820 Encounter for screening for osteoporosis: Secondary | ICD-10-CM | POA: Diagnosis not present

## 2019-08-07 DIAGNOSIS — Z87891 Personal history of nicotine dependence: Secondary | ICD-10-CM | POA: Diagnosis not present

## 2019-08-07 DIAGNOSIS — R2681 Unsteadiness on feet: Secondary | ICD-10-CM | POA: Diagnosis not present

## 2019-08-07 DIAGNOSIS — M899 Disorder of bone, unspecified: Secondary | ICD-10-CM | POA: Diagnosis not present

## 2019-08-09 DIAGNOSIS — M25642 Stiffness of left hand, not elsewhere classified: Secondary | ICD-10-CM | POA: Diagnosis not present

## 2019-08-09 DIAGNOSIS — S62331D Displaced fracture of neck of second metacarpal bone, left hand, subsequent encounter for fracture with routine healing: Secondary | ICD-10-CM | POA: Diagnosis not present

## 2019-08-09 DIAGNOSIS — M79642 Pain in left hand: Secondary | ICD-10-CM | POA: Diagnosis not present

## 2019-08-09 DIAGNOSIS — R531 Weakness: Secondary | ICD-10-CM | POA: Diagnosis not present

## 2019-08-13 DIAGNOSIS — M25642 Stiffness of left hand, not elsewhere classified: Secondary | ICD-10-CM | POA: Diagnosis not present

## 2019-08-13 DIAGNOSIS — S62331D Displaced fracture of neck of second metacarpal bone, left hand, subsequent encounter for fracture with routine healing: Secondary | ICD-10-CM | POA: Diagnosis not present

## 2019-08-13 DIAGNOSIS — Z13820 Encounter for screening for osteoporosis: Secondary | ICD-10-CM | POA: Diagnosis not present

## 2019-08-13 DIAGNOSIS — M79642 Pain in left hand: Secondary | ICD-10-CM | POA: Diagnosis not present

## 2019-08-13 DIAGNOSIS — M8589 Other specified disorders of bone density and structure, multiple sites: Secondary | ICD-10-CM | POA: Diagnosis not present

## 2019-08-13 DIAGNOSIS — R531 Weakness: Secondary | ICD-10-CM | POA: Diagnosis not present

## 2019-08-14 DIAGNOSIS — R011 Cardiac murmur, unspecified: Secondary | ICD-10-CM | POA: Diagnosis not present

## 2019-08-14 DIAGNOSIS — Z125 Encounter for screening for malignant neoplasm of prostate: Secondary | ICD-10-CM | POA: Diagnosis not present

## 2019-08-14 DIAGNOSIS — S62311D Displaced fracture of base of second metacarpal bone. left hand, subsequent encounter for fracture with routine healing: Secondary | ICD-10-CM | POA: Diagnosis not present

## 2019-08-14 DIAGNOSIS — Z136 Encounter for screening for cardiovascular disorders: Secondary | ICD-10-CM | POA: Diagnosis not present

## 2019-08-14 DIAGNOSIS — S62391S Other fracture of second metacarpal bone, left hand, sequela: Secondary | ICD-10-CM | POA: Diagnosis not present

## 2019-08-14 DIAGNOSIS — M25642 Stiffness of left hand, not elsewhere classified: Secondary | ICD-10-CM | POA: Diagnosis not present

## 2019-08-14 DIAGNOSIS — M19032 Primary osteoarthritis, left wrist: Secondary | ICD-10-CM | POA: Diagnosis not present

## 2019-08-14 DIAGNOSIS — M1812 Unilateral primary osteoarthritis of first carpometacarpal joint, left hand: Secondary | ICD-10-CM | POA: Diagnosis not present

## 2019-08-14 DIAGNOSIS — Z0000 Encounter for general adult medical examination without abnormal findings: Secondary | ICD-10-CM | POA: Diagnosis not present

## 2019-08-15 ENCOUNTER — Other Ambulatory Visit: Payer: Self-pay | Admitting: Nurse Practitioner

## 2019-08-15 DIAGNOSIS — M25642 Stiffness of left hand, not elsewhere classified: Secondary | ICD-10-CM | POA: Diagnosis not present

## 2019-08-15 DIAGNOSIS — M79642 Pain in left hand: Secondary | ICD-10-CM | POA: Diagnosis not present

## 2019-08-15 DIAGNOSIS — R531 Weakness: Secondary | ICD-10-CM | POA: Diagnosis not present

## 2019-08-15 DIAGNOSIS — S62331D Displaced fracture of neck of second metacarpal bone, left hand, subsequent encounter for fracture with routine healing: Secondary | ICD-10-CM | POA: Diagnosis not present

## 2019-08-15 DIAGNOSIS — Z1211 Encounter for screening for malignant neoplasm of colon: Secondary | ICD-10-CM | POA: Diagnosis not present

## 2019-08-20 DIAGNOSIS — M79642 Pain in left hand: Secondary | ICD-10-CM | POA: Diagnosis not present

## 2019-08-20 DIAGNOSIS — M25642 Stiffness of left hand, not elsewhere classified: Secondary | ICD-10-CM | POA: Diagnosis not present

## 2019-08-20 DIAGNOSIS — S62331D Displaced fracture of neck of second metacarpal bone, left hand, subsequent encounter for fracture with routine healing: Secondary | ICD-10-CM | POA: Diagnosis not present

## 2019-08-20 DIAGNOSIS — R531 Weakness: Secondary | ICD-10-CM | POA: Diagnosis not present

## 2019-08-22 ENCOUNTER — Other Ambulatory Visit: Payer: Self-pay | Admitting: Nurse Practitioner

## 2019-08-22 DIAGNOSIS — Z0000 Encounter for general adult medical examination without abnormal findings: Secondary | ICD-10-CM

## 2019-08-23 DIAGNOSIS — S62331D Displaced fracture of neck of second metacarpal bone, left hand, subsequent encounter for fracture with routine healing: Secondary | ICD-10-CM | POA: Diagnosis not present

## 2019-08-23 DIAGNOSIS — M79642 Pain in left hand: Secondary | ICD-10-CM | POA: Diagnosis not present

## 2019-08-23 DIAGNOSIS — R531 Weakness: Secondary | ICD-10-CM | POA: Diagnosis not present

## 2019-08-23 DIAGNOSIS — M25642 Stiffness of left hand, not elsewhere classified: Secondary | ICD-10-CM | POA: Diagnosis not present

## 2019-08-27 DIAGNOSIS — M25642 Stiffness of left hand, not elsewhere classified: Secondary | ICD-10-CM | POA: Diagnosis not present

## 2019-08-27 DIAGNOSIS — S62331D Displaced fracture of neck of second metacarpal bone, left hand, subsequent encounter for fracture with routine healing: Secondary | ICD-10-CM | POA: Diagnosis not present

## 2019-08-27 DIAGNOSIS — R531 Weakness: Secondary | ICD-10-CM | POA: Diagnosis not present

## 2019-08-27 DIAGNOSIS — M79642 Pain in left hand: Secondary | ICD-10-CM | POA: Diagnosis not present

## 2019-08-28 ENCOUNTER — Ambulatory Visit (INDEPENDENT_AMBULATORY_CARE_PROVIDER_SITE_OTHER): Payer: Medicare HMO

## 2019-08-28 ENCOUNTER — Other Ambulatory Visit: Payer: Self-pay

## 2019-08-28 DIAGNOSIS — R011 Cardiac murmur, unspecified: Secondary | ICD-10-CM

## 2019-08-28 DIAGNOSIS — Z0000 Encounter for general adult medical examination without abnormal findings: Secondary | ICD-10-CM

## 2019-08-31 DIAGNOSIS — S42132D Displaced fracture of coracoid process, left shoulder, subsequent encounter for fracture with routine healing: Secondary | ICD-10-CM | POA: Diagnosis not present

## 2019-08-31 DIAGNOSIS — S2243XA Multiple fractures of ribs, bilateral, initial encounter for closed fracture: Secondary | ICD-10-CM | POA: Diagnosis not present

## 2019-08-31 DIAGNOSIS — S32302A Unspecified fracture of left ilium, initial encounter for closed fracture: Secondary | ICD-10-CM | POA: Diagnosis not present

## 2019-08-31 DIAGNOSIS — S32302D Unspecified fracture of left ilium, subsequent encounter for fracture with routine healing: Secondary | ICD-10-CM | POA: Diagnosis not present

## 2019-08-31 DIAGNOSIS — T1490XA Injury, unspecified, initial encounter: Secondary | ICD-10-CM | POA: Diagnosis not present

## 2019-08-31 DIAGNOSIS — S42135D Nondisplaced fracture of coracoid process, left shoulder, subsequent encounter for fracture with routine healing: Secondary | ICD-10-CM | POA: Diagnosis not present

## 2019-09-01 DIAGNOSIS — S62331D Displaced fracture of neck of second metacarpal bone, left hand, subsequent encounter for fracture with routine healing: Secondary | ICD-10-CM | POA: Diagnosis not present

## 2019-09-03 DIAGNOSIS — S62331D Displaced fracture of neck of second metacarpal bone, left hand, subsequent encounter for fracture with routine healing: Secondary | ICD-10-CM | POA: Diagnosis not present

## 2019-09-03 DIAGNOSIS — M8588 Other specified disorders of bone density and structure, other site: Secondary | ICD-10-CM | POA: Diagnosis not present

## 2019-09-03 DIAGNOSIS — S22068D Other fracture of T7-T8 thoracic vertebra, subsequent encounter for fracture with routine healing: Secondary | ICD-10-CM | POA: Diagnosis not present

## 2019-09-03 DIAGNOSIS — S22078D Other fracture of T9-T10 vertebra, subsequent encounter for fracture with routine healing: Secondary | ICD-10-CM | POA: Diagnosis not present

## 2019-09-03 DIAGNOSIS — M5134 Other intervertebral disc degeneration, thoracic region: Secondary | ICD-10-CM | POA: Diagnosis not present

## 2019-09-03 DIAGNOSIS — M488X4 Other specified spondylopathies, thoracic region: Secondary | ICD-10-CM | POA: Diagnosis not present

## 2019-09-03 DIAGNOSIS — M50322 Other cervical disc degeneration at C5-C6 level: Secondary | ICD-10-CM | POA: Diagnosis not present

## 2019-09-03 DIAGNOSIS — Z981 Arthrodesis status: Secondary | ICD-10-CM | POA: Diagnosis not present

## 2019-09-03 DIAGNOSIS — S42102D Fracture of unspecified part of scapula, left shoulder, subsequent encounter for fracture with routine healing: Secondary | ICD-10-CM | POA: Diagnosis not present

## 2019-09-03 DIAGNOSIS — S22008D Other fracture of unspecified thoracic vertebra, subsequent encounter for fracture with routine healing: Secondary | ICD-10-CM | POA: Diagnosis not present

## 2019-09-03 DIAGNOSIS — M4312 Spondylolisthesis, cervical region: Secondary | ICD-10-CM | POA: Diagnosis not present

## 2019-09-03 DIAGNOSIS — S2242XD Multiple fractures of ribs, left side, subsequent encounter for fracture with routine healing: Secondary | ICD-10-CM | POA: Diagnosis not present

## 2019-09-03 DIAGNOSIS — S22058D Other fracture of T5-T6 vertebra, subsequent encounter for fracture with routine healing: Secondary | ICD-10-CM | POA: Diagnosis not present

## 2019-09-05 DIAGNOSIS — Z8781 Personal history of (healed) traumatic fracture: Secondary | ICD-10-CM | POA: Diagnosis not present

## 2019-09-05 DIAGNOSIS — Z13820 Encounter for screening for osteoporosis: Secondary | ICD-10-CM | POA: Diagnosis not present

## 2019-09-05 DIAGNOSIS — M8589 Other specified disorders of bone density and structure, multiple sites: Secondary | ICD-10-CM | POA: Diagnosis not present

## 2019-09-05 DIAGNOSIS — R2681 Unsteadiness on feet: Secondary | ICD-10-CM | POA: Diagnosis not present

## 2019-09-05 DIAGNOSIS — Z9181 History of falling: Secondary | ICD-10-CM | POA: Diagnosis not present

## 2019-09-05 DIAGNOSIS — M8580 Other specified disorders of bone density and structure, unspecified site: Secondary | ICD-10-CM | POA: Diagnosis not present

## 2019-09-05 DIAGNOSIS — Z87891 Personal history of nicotine dependence: Secondary | ICD-10-CM | POA: Diagnosis not present

## 2019-09-06 DIAGNOSIS — R531 Weakness: Secondary | ICD-10-CM | POA: Diagnosis not present

## 2019-09-06 DIAGNOSIS — M25642 Stiffness of left hand, not elsewhere classified: Secondary | ICD-10-CM | POA: Diagnosis not present

## 2019-09-06 DIAGNOSIS — S62331D Displaced fracture of neck of second metacarpal bone, left hand, subsequent encounter for fracture with routine healing: Secondary | ICD-10-CM | POA: Diagnosis not present

## 2019-09-06 DIAGNOSIS — M79642 Pain in left hand: Secondary | ICD-10-CM | POA: Diagnosis not present

## 2019-09-10 DIAGNOSIS — R531 Weakness: Secondary | ICD-10-CM | POA: Diagnosis not present

## 2019-09-10 DIAGNOSIS — S62331D Displaced fracture of neck of second metacarpal bone, left hand, subsequent encounter for fracture with routine healing: Secondary | ICD-10-CM | POA: Diagnosis not present

## 2019-09-10 DIAGNOSIS — M25642 Stiffness of left hand, not elsewhere classified: Secondary | ICD-10-CM | POA: Diagnosis not present

## 2019-09-10 DIAGNOSIS — M79642 Pain in left hand: Secondary | ICD-10-CM | POA: Diagnosis not present

## 2019-09-17 DIAGNOSIS — R531 Weakness: Secondary | ICD-10-CM | POA: Diagnosis not present

## 2019-09-17 DIAGNOSIS — S62331D Displaced fracture of neck of second metacarpal bone, left hand, subsequent encounter for fracture with routine healing: Secondary | ICD-10-CM | POA: Diagnosis not present

## 2019-09-17 DIAGNOSIS — M79642 Pain in left hand: Secondary | ICD-10-CM | POA: Diagnosis not present

## 2019-09-17 DIAGNOSIS — M25642 Stiffness of left hand, not elsewhere classified: Secondary | ICD-10-CM | POA: Diagnosis not present

## 2019-10-01 DIAGNOSIS — S2243XA Multiple fractures of ribs, bilateral, initial encounter for closed fracture: Secondary | ICD-10-CM | POA: Diagnosis not present

## 2019-10-01 DIAGNOSIS — T1490XA Injury, unspecified, initial encounter: Secondary | ICD-10-CM | POA: Diagnosis not present

## 2019-10-01 DIAGNOSIS — S32302A Unspecified fracture of left ilium, initial encounter for closed fracture: Secondary | ICD-10-CM | POA: Diagnosis not present

## 2019-11-01 DIAGNOSIS — S32302A Unspecified fracture of left ilium, initial encounter for closed fracture: Secondary | ICD-10-CM | POA: Diagnosis not present

## 2019-11-01 DIAGNOSIS — T1490XA Injury, unspecified, initial encounter: Secondary | ICD-10-CM | POA: Diagnosis not present

## 2019-11-01 DIAGNOSIS — S2243XA Multiple fractures of ribs, bilateral, initial encounter for closed fracture: Secondary | ICD-10-CM | POA: Diagnosis not present

## 2019-11-29 DIAGNOSIS — S32302A Unspecified fracture of left ilium, initial encounter for closed fracture: Secondary | ICD-10-CM | POA: Diagnosis not present

## 2019-11-29 DIAGNOSIS — S2243XA Multiple fractures of ribs, bilateral, initial encounter for closed fracture: Secondary | ICD-10-CM | POA: Diagnosis not present

## 2019-11-29 DIAGNOSIS — T1490XA Injury, unspecified, initial encounter: Secondary | ICD-10-CM | POA: Diagnosis not present

## 2019-12-17 DIAGNOSIS — L02519 Cutaneous abscess of unspecified hand: Secondary | ICD-10-CM | POA: Diagnosis not present

## 2019-12-17 DIAGNOSIS — L03119 Cellulitis of unspecified part of limb: Secondary | ICD-10-CM | POA: Diagnosis not present

## 2019-12-27 DIAGNOSIS — M189 Osteoarthritis of first carpometacarpal joint, unspecified: Secondary | ICD-10-CM | POA: Diagnosis not present

## 2019-12-27 DIAGNOSIS — M19042 Primary osteoarthritis, left hand: Secondary | ICD-10-CM | POA: Diagnosis not present

## 2019-12-27 DIAGNOSIS — S62331G Displaced fracture of neck of second metacarpal bone, left hand, subsequent encounter for fracture with delayed healing: Secondary | ICD-10-CM | POA: Diagnosis not present

## 2019-12-30 DIAGNOSIS — S32302A Unspecified fracture of left ilium, initial encounter for closed fracture: Secondary | ICD-10-CM | POA: Diagnosis not present

## 2019-12-30 DIAGNOSIS — T1490XA Injury, unspecified, initial encounter: Secondary | ICD-10-CM | POA: Diagnosis not present

## 2019-12-30 DIAGNOSIS — S2243XA Multiple fractures of ribs, bilateral, initial encounter for closed fracture: Secondary | ICD-10-CM | POA: Diagnosis not present

## 2020-01-29 DIAGNOSIS — S32302A Unspecified fracture of left ilium, initial encounter for closed fracture: Secondary | ICD-10-CM | POA: Diagnosis not present

## 2020-01-29 DIAGNOSIS — S2243XA Multiple fractures of ribs, bilateral, initial encounter for closed fracture: Secondary | ICD-10-CM | POA: Diagnosis not present

## 2020-01-29 DIAGNOSIS — T1490XA Injury, unspecified, initial encounter: Secondary | ICD-10-CM | POA: Diagnosis not present

## 2020-02-29 DIAGNOSIS — S2243XA Multiple fractures of ribs, bilateral, initial encounter for closed fracture: Secondary | ICD-10-CM | POA: Diagnosis not present

## 2020-02-29 DIAGNOSIS — T1490XA Injury, unspecified, initial encounter: Secondary | ICD-10-CM | POA: Diagnosis not present

## 2020-02-29 DIAGNOSIS — S32302A Unspecified fracture of left ilium, initial encounter for closed fracture: Secondary | ICD-10-CM | POA: Diagnosis not present

## 2020-03-30 DIAGNOSIS — T1490XA Injury, unspecified, initial encounter: Secondary | ICD-10-CM | POA: Diagnosis not present

## 2020-03-30 DIAGNOSIS — S2243XA Multiple fractures of ribs, bilateral, initial encounter for closed fracture: Secondary | ICD-10-CM | POA: Diagnosis not present

## 2020-03-30 DIAGNOSIS — S32302A Unspecified fracture of left ilium, initial encounter for closed fracture: Secondary | ICD-10-CM | POA: Diagnosis not present

## 2020-07-29 DIAGNOSIS — Z0000 Encounter for general adult medical examination without abnormal findings: Secondary | ICD-10-CM | POA: Diagnosis not present

## 2020-07-29 DIAGNOSIS — E7800 Pure hypercholesterolemia, unspecified: Secondary | ICD-10-CM | POA: Diagnosis not present

## 2020-07-29 DIAGNOSIS — Z23 Encounter for immunization: Secondary | ICD-10-CM | POA: Diagnosis not present

## 2020-07-29 DIAGNOSIS — Z1211 Encounter for screening for malignant neoplasm of colon: Secondary | ICD-10-CM | POA: Diagnosis not present

## 2020-07-29 DIAGNOSIS — R011 Cardiac murmur, unspecified: Secondary | ICD-10-CM | POA: Diagnosis not present

## 2020-07-29 DIAGNOSIS — I10 Essential (primary) hypertension: Secondary | ICD-10-CM | POA: Diagnosis not present

## 2020-08-19 DIAGNOSIS — Z1211 Encounter for screening for malignant neoplasm of colon: Secondary | ICD-10-CM | POA: Diagnosis not present

## 2020-11-14 DIAGNOSIS — Z23 Encounter for immunization: Secondary | ICD-10-CM | POA: Diagnosis not present

## 2021-08-04 DIAGNOSIS — Z0000 Encounter for general adult medical examination without abnormal findings: Secondary | ICD-10-CM | POA: Diagnosis not present

## 2021-08-04 DIAGNOSIS — Z23 Encounter for immunization: Secondary | ICD-10-CM | POA: Diagnosis not present

## 2021-08-04 DIAGNOSIS — I10 Essential (primary) hypertension: Secondary | ICD-10-CM | POA: Diagnosis not present

## 2021-08-04 DIAGNOSIS — I779 Disorder of arteries and arterioles, unspecified: Secondary | ICD-10-CM | POA: Diagnosis not present

## 2021-08-04 DIAGNOSIS — E7800 Pure hypercholesterolemia, unspecified: Secondary | ICD-10-CM | POA: Diagnosis not present

## 2021-08-04 DIAGNOSIS — Z1211 Encounter for screening for malignant neoplasm of colon: Secondary | ICD-10-CM | POA: Diagnosis not present

## 2021-08-04 DIAGNOSIS — I739 Peripheral vascular disease, unspecified: Secondary | ICD-10-CM | POA: Diagnosis not present

## 2021-08-04 DIAGNOSIS — J019 Acute sinusitis, unspecified: Secondary | ICD-10-CM | POA: Diagnosis not present

## 2021-08-04 DIAGNOSIS — M961 Postlaminectomy syndrome, not elsewhere classified: Secondary | ICD-10-CM | POA: Diagnosis not present

## 2021-08-04 DIAGNOSIS — K279 Peptic ulcer, site unspecified, unspecified as acute or chronic, without hemorrhage or perforation: Secondary | ICD-10-CM | POA: Diagnosis not present

## 2021-08-04 DIAGNOSIS — Z125 Encounter for screening for malignant neoplasm of prostate: Secondary | ICD-10-CM | POA: Diagnosis not present

## 2021-10-07 DIAGNOSIS — Z1211 Encounter for screening for malignant neoplasm of colon: Secondary | ICD-10-CM | POA: Diagnosis not present

## 2021-11-09 DIAGNOSIS — R195 Other fecal abnormalities: Secondary | ICD-10-CM | POA: Diagnosis not present

## 2021-11-13 DIAGNOSIS — Z1211 Encounter for screening for malignant neoplasm of colon: Secondary | ICD-10-CM | POA: Diagnosis not present

## 2021-11-13 DIAGNOSIS — D125 Benign neoplasm of sigmoid colon: Secondary | ICD-10-CM | POA: Diagnosis not present

## 2021-11-13 DIAGNOSIS — D123 Benign neoplasm of transverse colon: Secondary | ICD-10-CM | POA: Diagnosis not present

## 2021-11-13 DIAGNOSIS — D175 Benign lipomatous neoplasm of intra-abdominal organs: Secondary | ICD-10-CM | POA: Diagnosis not present

## 2021-11-13 DIAGNOSIS — R195 Other fecal abnormalities: Secondary | ICD-10-CM | POA: Diagnosis not present

## 2021-11-13 DIAGNOSIS — K5730 Diverticulosis of large intestine without perforation or abscess without bleeding: Secondary | ICD-10-CM | POA: Diagnosis not present

## 2021-11-13 DIAGNOSIS — D122 Benign neoplasm of ascending colon: Secondary | ICD-10-CM | POA: Diagnosis not present

## 2021-11-13 DIAGNOSIS — D128 Benign neoplasm of rectum: Secondary | ICD-10-CM | POA: Diagnosis not present

## 2021-11-17 DIAGNOSIS — D122 Benign neoplasm of ascending colon: Secondary | ICD-10-CM | POA: Diagnosis not present

## 2021-11-17 DIAGNOSIS — D128 Benign neoplasm of rectum: Secondary | ICD-10-CM | POA: Diagnosis not present

## 2021-11-17 DIAGNOSIS — D123 Benign neoplasm of transverse colon: Secondary | ICD-10-CM | POA: Diagnosis not present

## 2022-02-02 DIAGNOSIS — I779 Disorder of arteries and arterioles, unspecified: Secondary | ICD-10-CM | POA: Diagnosis not present

## 2022-02-02 DIAGNOSIS — M961 Postlaminectomy syndrome, not elsewhere classified: Secondary | ICD-10-CM | POA: Diagnosis not present

## 2022-02-02 DIAGNOSIS — E7800 Pure hypercholesterolemia, unspecified: Secondary | ICD-10-CM | POA: Diagnosis not present

## 2022-02-02 DIAGNOSIS — I10 Essential (primary) hypertension: Secondary | ICD-10-CM | POA: Diagnosis not present

## 2022-06-21 DIAGNOSIS — H903 Sensorineural hearing loss, bilateral: Secondary | ICD-10-CM | POA: Diagnosis not present

## 2022-06-21 DIAGNOSIS — B369 Superficial mycosis, unspecified: Secondary | ICD-10-CM | POA: Diagnosis not present

## 2022-07-30 DIAGNOSIS — L03114 Cellulitis of left upper limb: Secondary | ICD-10-CM | POA: Diagnosis not present

## 2022-08-04 DIAGNOSIS — M79642 Pain in left hand: Secondary | ICD-10-CM | POA: Diagnosis not present

## 2022-08-05 DIAGNOSIS — M79642 Pain in left hand: Secondary | ICD-10-CM | POA: Diagnosis not present

## 2022-08-06 ENCOUNTER — Other Ambulatory Visit: Payer: Self-pay

## 2022-08-06 ENCOUNTER — Inpatient Hospital Stay (HOSPITAL_COMMUNITY)
Admission: EM | Admit: 2022-08-06 | Discharge: 2022-08-10 | DRG: 513 | Disposition: A | Discharge status: Home Health | Payer: Medicare HMO | Source: Ambulatory Visit | Attending: Internal Medicine | Admitting: Internal Medicine

## 2022-08-06 ENCOUNTER — Encounter (HOSPITAL_COMMUNITY): Payer: Self-pay

## 2022-08-06 DIAGNOSIS — Z87891 Personal history of nicotine dependence: Secondary | ICD-10-CM | POA: Diagnosis not present

## 2022-08-06 DIAGNOSIS — B9562 Methicillin resistant Staphylococcus aureus infection as the cause of diseases classified elsewhere: Secondary | ICD-10-CM | POA: Diagnosis present

## 2022-08-06 DIAGNOSIS — I739 Peripheral vascular disease, unspecified: Secondary | ICD-10-CM | POA: Diagnosis not present

## 2022-08-06 DIAGNOSIS — E871 Hypo-osmolality and hyponatremia: Secondary | ICD-10-CM | POA: Diagnosis present

## 2022-08-06 DIAGNOSIS — Z23 Encounter for immunization: Secondary | ICD-10-CM

## 2022-08-06 DIAGNOSIS — E785 Hyperlipidemia, unspecified: Secondary | ICD-10-CM | POA: Diagnosis present

## 2022-08-06 DIAGNOSIS — L02519 Cutaneous abscess of unspecified hand: Secondary | ICD-10-CM | POA: Diagnosis not present

## 2022-08-06 DIAGNOSIS — Z95820 Peripheral vascular angioplasty status with implants and grafts: Secondary | ICD-10-CM | POA: Diagnosis not present

## 2022-08-06 DIAGNOSIS — L02512 Cutaneous abscess of left hand: Secondary | ICD-10-CM | POA: Diagnosis present

## 2022-08-06 DIAGNOSIS — F17210 Nicotine dependence, cigarettes, uncomplicated: Secondary | ICD-10-CM | POA: Diagnosis not present

## 2022-08-06 DIAGNOSIS — I70201 Unspecified atherosclerosis of native arteries of extremities, right leg: Secondary | ICD-10-CM | POA: Diagnosis present

## 2022-08-06 DIAGNOSIS — M7989 Other specified soft tissue disorders: Secondary | ICD-10-CM | POA: Diagnosis not present

## 2022-08-06 DIAGNOSIS — M869 Osteomyelitis, unspecified: Principal | ICD-10-CM | POA: Diagnosis present

## 2022-08-06 DIAGNOSIS — G629 Polyneuropathy, unspecified: Secondary | ICD-10-CM | POA: Diagnosis present

## 2022-08-06 DIAGNOSIS — Z7982 Long term (current) use of aspirin: Secondary | ICD-10-CM

## 2022-08-06 LAB — COMPREHENSIVE METABOLIC PANEL
ALT: 22 U/L (ref 0–44)
AST: 24 U/L (ref 15–41)
Albumin: 3.5 g/dL (ref 3.5–5.0)
Alkaline Phosphatase: 49 U/L (ref 38–126)
Anion gap: 13 (ref 5–15)
BUN: 8 mg/dL (ref 8–23)
CO2: 25 mmol/L (ref 22–32)
Calcium: 9 mg/dL (ref 8.9–10.3)
Chloride: 90 mmol/L — ABNORMAL LOW (ref 98–111)
Creatinine, Ser: 0.68 mg/dL (ref 0.61–1.24)
GFR, Estimated: >60 mL/min (ref >60)
Glucose, Bld: 75 mg/dL (ref 70–99)
Potassium: 3.7 mmol/L (ref 3.5–5.1)
Sodium: 128 mmol/L — ABNORMAL LOW (ref 135–145)
Total Bilirubin: 1 mg/dL (ref 0.3–1.2)
Total Protein: 6.9 g/dL (ref 6.5–8.1)

## 2022-08-06 LAB — PROTIME-INR
INR: 1 (ref 0.8–1.2)
Prothrombin Time: 13.3 seconds (ref 11.4–15.2)

## 2022-08-06 LAB — LACTIC ACID, PLASMA: Lactic Acid, Venous: 0.6 mmol/L (ref 0.5–1.9)

## 2022-08-06 LAB — CBC WITH DIFFERENTIAL/PLATELET
Abs Immature Granulocytes: 0.03 10*3/uL (ref 0.00–0.07)
Basophils Absolute: 0.1 10*3/uL (ref 0.0–0.1)
Basophils Relative: 0 %
Eosinophils Absolute: 0.2 10*3/uL (ref 0.0–0.5)
Eosinophils Relative: 1 %
HCT: 38.8 % — ABNORMAL LOW (ref 39.0–52.0)
Hemoglobin: 13.4 g/dL (ref 13.0–17.0)
Immature Granulocytes: 0 %
Lymphocytes Relative: 19 %
Lymphs Abs: 2.6 10*3/uL (ref 0.7–4.0)
MCH: 33.3 pg (ref 26.0–34.0)
MCHC: 34.5 g/dL (ref 30.0–36.0)
MCV: 96.3 fL (ref 80.0–100.0)
Monocytes Absolute: 1.3 10*3/uL — ABNORMAL HIGH (ref 0.1–1.0)
Monocytes Relative: 10 %
Neutro Abs: 9.5 10*3/uL — ABNORMAL HIGH (ref 1.7–7.7)
Neutrophils Relative %: 70 %
Platelets: 286 10*3/uL (ref 150–400)
RBC: 4.03 MIL/uL — ABNORMAL LOW (ref 4.22–5.81)
RDW: 12.1 % (ref 11.5–15.5)
WBC: 13.7 10*3/uL — ABNORMAL HIGH (ref 4.0–10.5)
nRBC: 0 % (ref 0.0–0.2)

## 2022-08-06 NOTE — ED Triage Notes (Signed)
Pt was sent here today by Dr. Amedeo Plenty to be taken to the OR. He presents with infection/osteomyelitis to left hand and states Dr. Amedeo Plenty wants to wash it out. Redness, swelling with purulent drainage noted to left hand. Denies fevers/chills.

## 2022-08-06 NOTE — ED Provider Notes (Signed)
[place] EMERGENCY DEPARTMENT Provider Note   CSN: [id number] Arrival date & time: 08/06/22  1658     History {Add pertinent medical, surgical, social history, OB history to HPI:1} Chief Complaint  Patient presents with   Hand Problem    Joshua Walter is a 71 y.o. male presenting to ED with left hand pain and swelling.  Hx of hand surgery and pins placed for closed fracture of left hand.  Hx of recurring infection on top of left hand, and osteomyelitis in his right lower extremity.  He reports he went to [facility] and had an MRI this week and was told to come to the ED tonight for admission because of concerns about osteomyelitis and abscess in right hand.  Reports he was referred in by Dr Veronia Beets (please note that [facility] records are not visible on epic).  HPI     Home Medications Prior to Admission medications   Medication Sig Start Date End Date Taking? Authorizing Provider  acetaminophen (TYLENOL) 500 MG tablet Take 500 mg by mouth every 6 (six) hours as needed for pain.    [provider]  aspirin 81 MG tablet Take 81 mg by mouth daily.    [provider]  HYDROmorphone (DILAUDID) 8 MG tablet Take 4 mg by mouth every 4 (four) hours as needed. 12/11/12   Rhyne, Hulen Shouts, PA-C  Multiple Vitamin (MULTIVITAMIN WITH MINERALS) TABS Take 1 tablet by mouth daily.    [provider]  vancomycin (VANCOCIN) 1 GM/200ML SOLN Inject 1,250 mg into the vein 2 (two) times daily. Started on 10/27/12. [facility]. Expected length of treatment is approximately 6-8 weeks. 10/27/12   [provider]      Allergies    Patient has no known allergies.    Review of Systems   Review of Systems  Physical Exam Updated Vital Signs BP 116/79   Pulse 80   Temp 98.2 F (36.8 C)   Resp 16   Ht 5' 10.5" (1.791 m)   Wt 74.8 kg   SpO2 95%   BMI 23.34 kg/m  Physical Exam Constitutional:      General: He is not in acute  distress. HENT:     Head: Normocephalic and atraumatic.  Eyes:     Conjunctiva/sclera: Conjunctivae normal.     Pupils: Pupils are equal, round, and reactive to light.  Cardiovascular:     Rate and Rhythm: Normal rate and regular rhythm.  Pulmonary:     Effort: Pulmonary effort is normal. No respiratory distress.  Abdominal:     General: There is no distension.     Tenderness: There is no abdominal tenderness.  Musculoskeletal:     Comments: Fluctuant abscess x 2 on dorsum of left hand  Skin:    General: Skin is warm and dry.  Neurological:     General: No focal deficit present.     Mental Status: He is alert. Mental status is at baseline.  Psychiatric:        Mood and Affect: Mood normal.        Behavior: Behavior normal.     ED Results / Procedures / Treatments   Labs (all labs ordered are listed, but only abnormal results are displayed) Labs Reviewed  COMPREHENSIVE METABOLIC PANEL - Abnormal; Notable for the following components:      Result Value   Sodium 128 (*)    Chloride 90 (*)    All other components within normal limits  CBC WITH DIFFERENTIAL/PLATELET - Abnormal; Notable for the following components:   WBC 13.7 (*)    RBC 4.03 (*)    HCT 38.8 (*)    Neutro Abs 9.5 (*)    Monocytes Absolute 1.3 (*)    All other components within normal limits  CULTURE, BLOOD (ROUTINE X 2)  CULTURE, BLOOD (ROUTINE X 2)  LACTIC ACID, PLASMA  PROTIME-INR  LACTIC ACID, PLASMA    EKG None  Radiology No results found.  Procedures Procedures  {Document cardiac monitor, telemetry assessment procedure when appropriate:1}  Medications Ordered in ED Medications - No data to display  ED Course/ Medical Decision Making/ A&P                           Medical Decision Making  Left hand infection, abscess w/ possible underlying osteomyelitis per patient's report.  I spoke to [facility] oncall provider ***   {Document critical care time when appropriate:1} {Document  review of labs and clinical decision tools ie heart score, Chads2Vasc2 etc:1}  {Document your independent review of radiology images, and any outside records:1} {Document your discussion with family members, caretakers, and with consultants:1} {Document social determinants of health affecting pt's care:1} {Document your decision making why or why not admission, treatments were needed:1} Final Clinical Impression(s) / ED Diagnoses Final diagnoses:  None    Rx / DC Orders ED Discharge Orders     None

## 2022-08-06 NOTE — ED Provider Triage Note (Signed)
Emergency Medicine Provider Triage Evaluation Note  Joshua Walter , a 71 y.o. male  was evaluated in triage.  Patient was sent to ER by Dr. Laurell Roof to be taken to the OR.  He presents with infection and osteomyelitis to his left hand.  He had MRI imaging done, Dr. Laurell Roof informed patient he needs to proceed to hospital for evaluation and to set up surgery.  He has redness, swelling, and purulent drainage to the left hand.  He denies fever and chills.  Review of Systems  Positive: See above Negative: See above  Physical Exam  BP (!) 160/85 (BP Location: Left Arm)   Pulse 79   Temp 97.9 F (36.6 C) (Oral)   Resp 18   Ht 5' 10.5" (1.791 m)   Wt 74.8 kg   SpO2 100%   BMI 23.34 kg/m  Gen:   Awake, no distress   Resp:  Normal effort  MSK:   Moves extremities without difficulty  Other:  See photo  Medical Decision Making  Medically screening exam initiated at 6:16 PM.  Appropriate orders placed.  Joshua Walter was informed that the remainder of the evaluation will be completed by another provider, this initial triage assessment does not replace that evaluation, and the importance of remaining in the ED until their evaluation is complete.     Theressa Stamps R, [place] 08/06/22 1821

## 2022-08-07 ENCOUNTER — Other Ambulatory Visit: Payer: Self-pay

## 2022-08-07 ENCOUNTER — Emergency Department (HOSPITAL_COMMUNITY): Payer: Medicare HMO

## 2022-08-07 ENCOUNTER — Inpatient Hospital Stay (HOSPITAL_COMMUNITY): Payer: Medicare HMO

## 2022-08-07 ENCOUNTER — Inpatient Hospital Stay (HOSPITAL_COMMUNITY): Payer: Medicare HMO | Admitting: Certified Registered"

## 2022-08-07 ENCOUNTER — Encounter (HOSPITAL_COMMUNITY)
Admission: EM | Disposition: A | Discharge status: Home Health | Payer: Self-pay | Source: Ambulatory Visit | Attending: Internal Medicine

## 2022-08-07 ENCOUNTER — Encounter (HOSPITAL_COMMUNITY): Payer: Self-pay | Admitting: Internal Medicine

## 2022-08-07 DIAGNOSIS — Z23 Encounter for immunization: Secondary | ICD-10-CM | POA: Diagnosis not present

## 2022-08-07 DIAGNOSIS — Z95820 Peripheral vascular angioplasty status with implants and grafts: Secondary | ICD-10-CM | POA: Diagnosis not present

## 2022-08-07 DIAGNOSIS — F17210 Nicotine dependence, cigarettes, uncomplicated: Secondary | ICD-10-CM

## 2022-08-07 DIAGNOSIS — Z7982 Long term (current) use of aspirin: Secondary | ICD-10-CM | POA: Diagnosis not present

## 2022-08-07 DIAGNOSIS — G629 Polyneuropathy, unspecified: Secondary | ICD-10-CM | POA: Diagnosis present

## 2022-08-07 DIAGNOSIS — I739 Peripheral vascular disease, unspecified: Secondary | ICD-10-CM | POA: Diagnosis not present

## 2022-08-07 DIAGNOSIS — L02512 Cutaneous abscess of left hand: Secondary | ICD-10-CM

## 2022-08-07 DIAGNOSIS — E785 Hyperlipidemia, unspecified: Secondary | ICD-10-CM | POA: Diagnosis present

## 2022-08-07 DIAGNOSIS — L02519 Cutaneous abscess of unspecified hand: Principal | ICD-10-CM | POA: Diagnosis present

## 2022-08-07 DIAGNOSIS — M869 Osteomyelitis, unspecified: Secondary | ICD-10-CM

## 2022-08-07 DIAGNOSIS — Z87891 Personal history of nicotine dependence: Secondary | ICD-10-CM | POA: Diagnosis not present

## 2022-08-07 DIAGNOSIS — I70201 Unspecified atherosclerosis of native arteries of extremities, right leg: Secondary | ICD-10-CM | POA: Diagnosis present

## 2022-08-07 DIAGNOSIS — B9562 Methicillin resistant Staphylococcus aureus infection as the cause of diseases classified elsewhere: Secondary | ICD-10-CM | POA: Diagnosis present

## 2022-08-07 DIAGNOSIS — E871 Hypo-osmolality and hyponatremia: Secondary | ICD-10-CM | POA: Diagnosis present

## 2022-08-07 HISTORY — PX: I & D EXTREMITY: SHX5045

## 2022-08-07 LAB — CBC WITH DIFFERENTIAL/PLATELET
Abs Immature Granulocytes: 0.02 10*3/uL (ref 0.00–0.07)
Basophils Absolute: 0 10*3/uL (ref 0.0–0.1)
Basophils Relative: 0 %
Eosinophils Absolute: 0.1 10*3/uL (ref 0.0–0.5)
Eosinophils Relative: 1 %
HCT: 38.9 % — ABNORMAL LOW (ref 39.0–52.0)
Hemoglobin: 13.6 g/dL (ref 13.0–17.0)
Immature Granulocytes: 0 %
Lymphocytes Relative: 17 %
Lymphs Abs: 1.7 10*3/uL (ref 0.7–4.0)
MCH: 33.4 pg (ref 26.0–34.0)
MCHC: 35 g/dL (ref 30.0–36.0)
MCV: 95.6 fL (ref 80.0–100.0)
Monocytes Absolute: 1.2 10*3/uL — ABNORMAL HIGH (ref 0.1–1.0)
Monocytes Relative: 12 %
Neutro Abs: 6.6 10*3/uL (ref 1.7–7.7)
Neutrophils Relative %: 70 %
Platelets: 283 10*3/uL (ref 150–400)
RBC: 4.07 MIL/uL — ABNORMAL LOW (ref 4.22–5.81)
RDW: 12.3 % (ref 11.5–15.5)
WBC: 9.5 10*3/uL (ref 4.0–10.5)
nRBC: 0 % (ref 0.0–0.2)

## 2022-08-07 LAB — BASIC METABOLIC PANEL
Anion gap: 9 (ref 5–15)
BUN: 7 mg/dL — ABNORMAL LOW (ref 8–23)
CO2: 25 mmol/L (ref 22–32)
Calcium: 8.9 mg/dL (ref 8.9–10.3)
Chloride: 100 mmol/L (ref 98–111)
Creatinine, Ser: 0.58 mg/dL — ABNORMAL LOW (ref 0.61–1.24)
GFR, Estimated: >60 mL/min (ref >60)
Glucose, Bld: 108 mg/dL — ABNORMAL HIGH (ref 70–99)
Potassium: 4.2 mmol/L (ref 3.5–5.1)
Sodium: 134 mmol/L — ABNORMAL LOW (ref 135–145)

## 2022-08-07 LAB — MRSA NEXT GEN BY PCR, NASAL: MRSA by PCR Next Gen: NOT DETECTED

## 2022-08-07 LAB — MAGNESIUM: Magnesium: 1.8 mg/dL (ref 1.7–2.4)

## 2022-08-07 LAB — C-REACTIVE PROTEIN: CRP: 3.1 mg/dL — ABNORMAL HIGH (ref <1.0)

## 2022-08-07 LAB — HIV ANTIBODY (ROUTINE TESTING W REFLEX): HIV Screen 4th Generation wRfx: NONREACTIVE

## 2022-08-07 LAB — PHOSPHORUS: Phosphorus: 2.6 mg/dL (ref 2.5–4.6)

## 2022-08-07 LAB — SEDIMENTATION RATE: Sed Rate: 26 mm/hr — ABNORMAL HIGH (ref 0–16)

## 2022-08-07 SURGERY — IRRIGATION AND DEBRIDEMENT EXTREMITY
Anesthesia: General | Laterality: Left

## 2022-08-07 MED ORDER — PROPOFOL 10 MG/ML IV BOLUS
INTRAVENOUS | Status: DC | PRN
  Administered 2022-08-07: 160 mg via INTRAVENOUS

## 2022-08-07 MED ORDER — HYDROMORPHONE HCL 1 MG/ML IJ SOLN
0.5000 mg | INTRAMUSCULAR | Status: DC | PRN
  Administered 2022-08-07 – 2022-08-08 (×6): 0.5 mg via INTRAVENOUS
  Filled 2022-08-07 (×6): qty 0.5

## 2022-08-07 MED ORDER — BACLOFEN 10 MG PO TABS
10.0000 mg | ORAL_TABLET | Freq: Every day | ORAL | Status: DC
  Administered 2022-08-07 – 2022-08-10 (×4): 10 mg via ORAL
  Filled 2022-08-07 (×4): qty 1

## 2022-08-07 MED ORDER — CHLORHEXIDINE GLUCONATE CLOTH 2 % EX PADS
6.0000 | MEDICATED_PAD | Freq: Every day | CUTANEOUS | Status: DC
  Administered 2022-08-07 – 2022-08-10 (×4): 6 via TOPICAL

## 2022-08-07 MED ORDER — VITAMIN C 500 MG PO TABS
1000.0000 mg | ORAL_TABLET | Freq: Every day | ORAL | Status: DC
  Administered 2022-08-07 – 2022-08-10 (×4): 1000 mg via ORAL
  Filled 2022-08-07 (×4): qty 2

## 2022-08-07 MED ORDER — ORAL CARE MOUTH RINSE: 15.0000 mL | Freq: Once | OROMUCOSAL | Status: AC

## 2022-08-07 MED ORDER — ONDANSETRON HCL 4 MG/2ML IJ SOLN
INTRAMUSCULAR | Status: DC | PRN
  Administered 2022-08-07: 4 mg via INTRAVENOUS

## 2022-08-07 MED ORDER — PROPOFOL 10 MG/ML IV BOLUS
INTRAVENOUS | Status: AC
  Filled 2022-08-07: qty 20

## 2022-08-07 MED ORDER — OXYCODONE HCL 5 MG PO TABS
5.0000 mg | ORAL_TABLET | Freq: Four times a day (QID) | ORAL | Status: DC | PRN
  Administered 2022-08-08 – 2022-08-10 (×5): 5 mg via ORAL
  Filled 2022-08-07 (×5): qty 1

## 2022-08-07 MED ORDER — FENTANYL CITRATE (PF) 250 MCG/5ML IJ SOLN
INTRAMUSCULAR | Status: DC | PRN
  Administered 2022-08-07 (×2): 50 ug via INTRAVENOUS

## 2022-08-07 MED ORDER — B COMPLEX-C PO TABS
1.0000 | ORAL_TABLET | Freq: Every day | ORAL | Status: DC
  Administered 2022-08-08 – 2022-08-10 (×3): 1 via ORAL
  Filled 2022-08-07 (×4): qty 1

## 2022-08-07 MED ORDER — GABAPENTIN 300 MG PO CAPS: 300.0000 mg | ORAL_CAPSULE | Freq: Three times a day (TID) | ORAL | Status: DC

## 2022-08-07 MED ORDER — CHLORHEXIDINE GLUCONATE 0.12 % MT SOLN
OROMUCOSAL | Status: AC
  Administered 2022-08-07: 15 mL via OROMUCOSAL
  Filled 2022-08-07: qty 15

## 2022-08-07 MED ORDER — LACTATED RINGERS IV SOLN: INTRAVENOUS | Status: DC

## 2022-08-07 MED ORDER — SODIUM CHLORIDE 0.9 % IV SOLN
2.0000 g | Freq: Three times a day (TID) | INTRAVENOUS | Status: DC
  Administered 2022-08-07 – 2022-08-09 (×7): 2 g via INTRAVENOUS
  Filled 2022-08-07 (×9): qty 12.5

## 2022-08-07 MED ORDER — LIDOCAINE 2% (20 MG/ML) 5 ML SYRINGE
INTRAMUSCULAR | Status: DC | PRN
  Administered 2022-08-07: 60 mg via INTRAVENOUS

## 2022-08-07 MED ORDER — ADULT MULTIVITAMIN W/MINERALS CH
1.0000 | ORAL_TABLET | Freq: Every day | ORAL | Status: DC
  Administered 2022-08-08 – 2022-08-10 (×3): 1 via ORAL
  Filled 2022-08-07 (×3): qty 1

## 2022-08-07 MED ORDER — HEPARIN SODIUM (PORCINE) 5000 UNIT/ML IJ SOLN
5000.0000 [IU] | Freq: Three times a day (TID) | INTRAMUSCULAR | Status: DC
  Administered 2022-08-07 – 2022-08-10 (×7): 5000 [IU] via SUBCUTANEOUS
  Filled 2022-08-07 (×7): qty 1

## 2022-08-07 MED ORDER — VANCOMYCIN HCL IN DEXTROSE 1-5 GM/200ML-% IV SOLN: 1000.0000 mg | INTRAVENOUS | Status: DC

## 2022-08-07 MED ORDER — ACETAMINOPHEN 325 MG PO TABS
650.0000 mg | ORAL_TABLET | Freq: Four times a day (QID) | ORAL | Status: DC | PRN
  Administered 2022-08-09: 650 mg via ORAL
  Filled 2022-08-07: qty 2

## 2022-08-07 MED ORDER — PROCHLORPERAZINE EDISYLATE 10 MG/2ML IJ SOLN: 5.0000 mg | Freq: Four times a day (QID) | INTRAMUSCULAR | Status: DC | PRN

## 2022-08-07 MED ORDER — SIMVASTATIN 20 MG PO TABS
40.0000 mg | ORAL_TABLET | Freq: Every day | ORAL | Status: DC
  Administered 2022-08-07 – 2022-08-09 (×4): 40 mg via ORAL
  Filled 2022-08-07 (×4): qty 2

## 2022-08-07 MED ORDER — GABAPENTIN 300 MG PO CAPS
300.0000 mg | ORAL_CAPSULE | Freq: Every day | ORAL | Status: DC
  Administered 2022-08-08 – 2022-08-09 (×2): 300 mg via ORAL
  Filled 2022-08-07 (×2): qty 1

## 2022-08-07 MED ORDER — CHLORHEXIDINE GLUCONATE 0.12 % MT SOLN: 15.0000 mL | Freq: Once | OROMUCOSAL | Status: AC

## 2022-08-07 MED ORDER — 0.9 % SODIUM CHLORIDE (POUR BTL) OPTIME
TOPICAL | Status: DC | PRN
  Administered 2022-08-07: 1000 mL

## 2022-08-07 MED ORDER — BUPIVACAINE HCL (PF) 0.25 % IJ SOLN
INTRAMUSCULAR | Status: AC
  Filled 2022-08-07: qty 10

## 2022-08-07 MED ORDER — GABAPENTIN 300 MG PO CAPS
600.0000 mg | ORAL_CAPSULE | Freq: Two times a day (BID) | ORAL | Status: DC
  Administered 2022-08-07 – 2022-08-10 (×7): 600 mg via ORAL
  Filled 2022-08-07 (×7): qty 2

## 2022-08-07 MED ORDER — VANCOMYCIN HCL 1500 MG/300ML IV SOLN
1500.0000 mg | Freq: Once | INTRAVENOUS | Status: DC
  Filled 2022-08-07: qty 300

## 2022-08-07 MED ORDER — VANCOMYCIN HCL IN DEXTROSE 1-5 GM/200ML-% IV SOLN
1000.0000 mg | Freq: Two times a day (BID) | INTRAVENOUS | Status: DC
  Administered 2022-08-07 – 2022-08-10 (×6): 1000 mg via INTRAVENOUS
  Filled 2022-08-07 (×8): qty 200

## 2022-08-07 MED ORDER — SODIUM CHLORIDE 0.9 % IV SOLN: INTRAVENOUS | Status: AC

## 2022-08-07 MED ORDER — VANCOMYCIN HCL 1500 MG/300ML IV SOLN
1500.0000 mg | Freq: Once | INTRAVENOUS | Status: AC
  Administered 2022-08-07: 1500 mg via INTRAVENOUS
  Filled 2022-08-07: qty 300

## 2022-08-07 MED ORDER — SODIUM CHLORIDE 0.9 % IR SOLN
Status: DC | PRN
  Administered 2022-08-07: 1000 mL

## 2022-08-07 MED ORDER — FENTANYL CITRATE (PF) 250 MCG/5ML IJ SOLN
INTRAMUSCULAR | Status: AC
  Filled 2022-08-07: qty 5

## 2022-08-07 MED ORDER — BUPIVACAINE HCL (PF) 0.25 % IJ SOLN
INTRAMUSCULAR | Status: DC | PRN
  Administered 2022-08-07: 10 mL

## 2022-08-07 SURGICAL SUPPLY — 63 items
ADAPTER CATH SYR TO TUBING 38M (ADAPTER) ×2 IMPLANT
ADPR CATH LL SYR 3/32 TPR (ADAPTER) ×1
BAG COUNTER SPONGE SURGICOUNT (BAG) ×2 IMPLANT
BAG SPNG CNTER NS LX DISP (BAG) ×1
BNDG CMPR 9X4 STRL LF SNTH (GAUZE/BANDAGES/DRESSINGS) ×1
BNDG COHESIVE 2X5 TAN STRL LF (GAUZE/BANDAGES/DRESSINGS) IMPLANT
BNDG ELASTIC 3X5.8 VLCR STR LF (GAUZE/BANDAGES/DRESSINGS) ×2 IMPLANT
BNDG ELASTIC 4X5.8 VLCR STR LF (GAUZE/BANDAGES/DRESSINGS) ×2 IMPLANT
BNDG ESMARK 4X9 LF (GAUZE/BANDAGES/DRESSINGS) IMPLANT
BNDG GAUZE DERMACEA FLUFF 4 (GAUZE/BANDAGES/DRESSINGS) ×2 IMPLANT
BNDG GZE DERMACEA 4 6PLY (GAUZE/BANDAGES/DRESSINGS) ×1
CANNULA VESSEL 3MM 2 BLNT TIP (CANNULA) IMPLANT
CNTNR URN SCR LID CUP LEK RST (MISCELLANEOUS) IMPLANT
CONT SPEC 4OZ STRL OR WHT (MISCELLANEOUS) ×1
CORD BIPOLAR FORCEPS 12FT (ELECTRODE) ×2 IMPLANT
COVER SURGICAL LIGHT HANDLE (MISCELLANEOUS) ×2 IMPLANT
CUFF TOURN SGL QUICK 18X4 (TOURNIQUET CUFF) IMPLANT
CUFF TOURN SGL QUICK 24 (TOURNIQUET CUFF)
CUFF TRNQT CYL 24X4X16.5-23 (TOURNIQUET CUFF) IMPLANT
DRAIN PENROSE 1/4X12 LTX STRL (WOUND CARE) IMPLANT
GAUZE PACKING IODOFORM 1/4X15 (PACKING) IMPLANT
GAUZE PAD ABD 8X10 STRL (GAUZE/BANDAGES/DRESSINGS) ×4 IMPLANT
GAUZE SPONGE 4X4 12PLY STRL (GAUZE/BANDAGES/DRESSINGS) ×2 IMPLANT
GAUZE XEROFORM 1X8 LF (GAUZE/BANDAGES/DRESSINGS) ×2 IMPLANT
GAUZE XEROFORM 5X9 LF (GAUZE/BANDAGES/DRESSINGS) IMPLANT
GLOVE BIO SURGEON STRL SZ7.5 (GLOVE) ×2 IMPLANT
GLOVE BIOGEL PI IND STRL 8 (GLOVE) ×2 IMPLANT
GLOVE BIOGEL PI IND STRL 8.5 (GLOVE) IMPLANT
GLOVE PI ORTHO PRO STRL SZ8 (GLOVE) IMPLANT
GLOVE SURG ORTHO 8.0 STRL STRW (GLOVE) IMPLANT
GOWN STRL REUS W/ TWL LRG LVL3 (GOWN DISPOSABLE) ×2 IMPLANT
GOWN STRL REUS W/ TWL XL LVL3 (GOWN DISPOSABLE) ×2 IMPLANT
GOWN STRL REUS W/TWL LRG LVL3 (GOWN DISPOSABLE) ×1
GOWN STRL REUS W/TWL XL LVL3 (GOWN DISPOSABLE) ×1
KIT BASIN OR (CUSTOM PROCEDURE TRAY) ×2 IMPLANT
KIT TURNOVER KIT B (KITS) ×2 IMPLANT
LOOP VESSEL MAXI 1X406 BLUE (MISCELLANEOUS)
LOOP VESSEL MAXI BLUE (MISCELLANEOUS) IMPLANT
MANIFOLD NEPTUNE II (INSTRUMENTS) IMPLANT
NDL HYPO 25X1 1.5 SAFETY (NEEDLE) IMPLANT
NEEDLE HYPO 25X1 1.5 SAFETY (NEEDLE) ×1 IMPLANT
NS IRRIG 1000ML POUR BTL (IV SOLUTION) ×2 IMPLANT
PACK ORTHO EXTREMITY (CUSTOM PROCEDURE TRAY) ×2 IMPLANT
PAD ARMBOARD 7.5X6 YLW CONV (MISCELLANEOUS) ×4 IMPLANT
PAD CAST 3X4 CTTN HI CHSV (CAST SUPPLIES) IMPLANT
PADDING CAST COTTON 3X4 STRL (CAST SUPPLIES) ×1
SET CYSTO W/LG BORE CLAMP LF (SET/KITS/TRAYS/PACK) IMPLANT
SOL PREP POV-IOD 4OZ 10% (MISCELLANEOUS) ×4 IMPLANT
SPIKE FLUID TRANSFER (MISCELLANEOUS) ×2 IMPLANT
SPLINT FIBERGLASS 3X12 (CAST SUPPLIES) IMPLANT
SPONGE T-LAP 4X18 ~~LOC~~+RFID (SPONGE) ×2 IMPLANT
SUT ETHILON 4 0 P 3 18 (SUTURE) IMPLANT
SUT ETHILON 4 0 PS 2 18 (SUTURE) IMPLANT
SUT MON AB 5-0 P3 18 (SUTURE) IMPLANT
SWAB COLLECTION DEVICE MRSA (MISCELLANEOUS) IMPLANT
SWAB CULTURE ESWAB REG 1ML (MISCELLANEOUS) IMPLANT
SYR 20ML LL LF (SYRINGE) ×2 IMPLANT
SYR CONTROL 10ML LL (SYRINGE) IMPLANT
TOWEL GREEN STERILE (TOWEL DISPOSABLE) ×2 IMPLANT
TUBE CONNECTING 12X1/4 (SUCTIONS) ×2 IMPLANT
TUBE FEEDING ENTERAL 5FR 16IN (TUBING) IMPLANT
UNDERPAD 30X36 HEAVY ABSORB (UNDERPADS AND DIAPERS) ×2 IMPLANT
YANKAUER SUCT BULB TIP NO VENT (SUCTIONS) ×2 IMPLANT
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
## 2022-08-07 NOTE — Anesthesia Preprocedure Evaluation (Addendum)
Anesthesia Evaluation  Patient identified by MRN, date of birth, ID band Patient awake    Reviewed: Allergy & Precautions, NPO status , Patient's Chart, lab work & pertinent test results  Airway Mallampati: II  TM Distance: >3 FB Neck ROM: Full    Dental  (+) Edentulous Upper, Edentulous Lower   Pulmonary former smoker   Pulmonary exam normal        Cardiovascular + Peripheral Vascular Disease   Rhythm:Regular Rate:Normal     Neuro/Psych    GI/Hepatic   Endo/Other    Renal/GU negative Renal ROS     Musculoskeletal   Abdominal Normal abdominal exam  (+)   Peds  Hematology   Anesthesia Other Findings   Reproductive/Obstetrics                             Anesthesia Physical Anesthesia Plan  ASA: 3  Anesthesia Plan: General   Post-op Pain Management:    Induction: Intravenous  PONV Risk Score and Plan: 2 and Ondansetron, Dexamethasone and Treatment may vary due to age or medical condition  Airway Management Planned: Mask and LMA  Additional Equipment: None  Intra-op Plan:   Post-operative Plan: Extubation in OR  Informed Consent: I have reviewed the patients History and Physical, chart, labs and discussed the procedure including the risks, benefits and alternatives for the proposed anesthesia with the patient or authorized representative who has indicated his/her understanding and acceptance.     Dental advisory given  Plan Discussed with: CRNA  Anesthesia Plan Comments:        Anesthesia Quick Evaluation

## 2022-08-07 NOTE — Anesthesia Postprocedure Evaluation (Signed)
Anesthesia Post Note  Patient: Joshua Walter  Procedure(s) Performed: IRRIGATION AND DEBRIDEMENT HAND (Left)     Patient location during evaluation: PACU Anesthesia Type: General Level of consciousness: awake and alert Pain management: pain level controlled Vital Signs Assessment: post-procedure vital signs reviewed and stable Respiratory status: spontaneous breathing, nonlabored ventilation, respiratory function stable and patient connected to nasal cannula oxygen Cardiovascular status: blood pressure returned to baseline and stable Postop Assessment: no apparent nausea or vomiting Anesthetic complications: no   No notable events documented.  Last Vitals:  Vitals:   08/07/22 1700 08/07/22 1956  BP: (!) 163/95 114/76  Pulse: 84   Resp: 17   Temp: 37 C 36.8 C  SpO2: 95% 95%    Last Pain:  Vitals:   08/07/22 1956  TempSrc: Oral  PainSc:                  March Rummage Taden Witter

## 2022-08-07 NOTE — Progress Notes (Signed)
PROGRESS NOTE    Joshua Walter  CVE:[id number] DOB: [birth year] DOA: 08/06/2022 PCP: [name], MD   Brief Narrative: 71 year old with past medical history significant for hyperlipidemia, peripheral vascular disease, prior osteomyelitis involving his right lower extremity presented to ED due to concern of left hand infection/osteomyelitis.  He was referred by his orthopedic to the ED for further evaluation, Dr. Annie Main.  Hand surgeon has been consulted Dr. Maryan Rued is planning to take patient to the OR on 11/18.  Patient was also found to have hyponatremia, leukocytosis with white blood cell of 13.  X-ray of the left hand showed: Cortical irregularity/destruction along the medial/ulnar aspect of the 2nd metacarpal head, suggesting osteomyelitis. Soft tissue swelling overlying the dorsal aspect of the hand.     Assessment & Plan:   Principal Problem:   Hand abscess Active Problems:   Peripheral vascular disease, unspecified ([place])   PVD (peripheral vascular disease) ([facility])   Hyponatremia  1-Left Hand Osteomyelitis;  Hand abscess, second metacarpal head concern for osteomyelitis Outpatient MRI: per report with osteomyelitis. I dont have report  Continue with vancomycin and cefepime.  Ortho planning washout 11/18   2-Hyponatremia: Increase IV fluids suspect hypovolemic 3-Peripheral Vascular disease 4-Hyperlipidemia: Continue with Zocor 5-Peripheral neuropathy: Continue with gabapentin     Estimated body mass index is 23.34 kg/m as calculated from the following:   Height as of this encounter: 5' 10.5" (1.791 m).   Weight as of this encounter: 74.8 kg.   DVT prophylaxis: Heparin  Code Status: Full code Family Communication: Disposition Plan:  Status is: Inpatient Remains inpatient appropriate because: management hand infection     Consultants:  Dr Fredna Dow  Procedures:    Antimicrobials:  Vancomycin 11/18 Cefepime 11/18  Subjective: He report swelling left  hand has decreased.   Objective: Vitals:   08/06/22 2332 08/06/22 2333 08/07/22 0439 08/07/22 0451  BP: 133/76  129/73 123/79  Pulse: 76  71 72  Resp: '18  19 17  '$ Temp:  97.8 F (36.6 C) 98 F (36.7 C) 98.4 F (36.9 C)  TempSrc:  Oral Oral Oral  SpO2: 98%  97% 100%  Weight:      Height:        Intake/Output Summary (Last 24 hours) at 08/07/2022 0711 Last data filed at 08/07/2022 0600 Gross per 24 hour  Intake 577.15 ml  Output --  Net 577.15 ml   Filed Weights   08/06/22 1802  Weight: 74.8 kg    Examination:  General exam: Appears calm and comfortable  Respiratory system: Clear to auscultation. Respiratory effort normal. Cardiovascular system: S1 & S2 heard, RRR. No JVD, murmurs, rubs, gallops or clicks. No pedal edema. Gastrointestinal system: Abdomen is nondistended, soft and nontender. No organomegaly or masses felt. Normal bowel sounds heard. Central nervous system: Alert and oriented.  Extremities: Symmetric 5 x 5 power. Left hand with abscess, pustule , redness, swelling.   Data Reviewed: I have personally reviewed following labs and imaging studies  CBC: Recent Labs  Lab 08/06/22 1826  WBC 13.7*  NEUTROABS 9.5*  HGB 13.4  HCT 38.8*  MCV 96.3  PLT 025   Basic Metabolic Panel: Recent Labs  Lab 08/06/22 1826  NA 128*  K 3.7  CL 90*  CO2 25  GLUCOSE 75  BUN 8  CREATININE 0.68  CALCIUM 9.0   GFR: Estimated Creatinine Clearance: 90.2 mL/min (by C-G formula based on SCr of 0.68 mg/dL). Liver Function Tests: Recent Labs  Lab 08/06/22 1826  AST 24  ALT 22  ALKPHOS 49  BILITOT 1.0  PROT 6.9  ALBUMIN 3.5   No results for input(s): "LIPASE", "AMYLASE" in the last 168 hours. No results for input(s): "AMMONIA" in the last 168 hours. Coagulation Profile: Recent Labs  Lab 08/06/22 1826  INR 1.0   Cardiac Enzymes: No results for input(s): "CKTOTAL", "CKMB", "CKMBINDEX", "TROPONINI" in the last 168 hours. BNP (last 3 results) No results for  input(s): "PROBNP" in the last 8760 hours. HbA1C: No results for input(s): "HGBA1C" in the last 72 hours. CBG: No results for input(s): "GLUCAP" in the last 168 hours. Lipid Profile: No results for input(s): "CHOL", "HDL", "LDLCALC", "TRIG", "CHOLHDL", "LDLDIRECT" in the last 72 hours. Thyroid Function Tests: No results for input(s): "TSH", "T4TOTAL", "FREET4", "T3FREE", "THYROIDAB" in the last 72 hours. Anemia Panel: No results for input(s): "VITAMINB12", "FOLATE", "FERRITIN", "TIBC", "IRON", "RETICCTPCT" in the last 72 hours. Sepsis Labs: Recent Labs  Lab 08/06/22 1826  LATICACIDVEN 0.6    Recent Results (from the past 240 hour(s))  Culture, blood (Routine x 2)     Status: None (Preliminary result)   Collection Time: 08/06/22  6:26 PM   Specimen: BLOOD  Result Value Ref Range Status   Specimen Description BLOOD RIGHT ANTECUBITAL  Final   Special Requests   Final    BOTTLES DRAWN AEROBIC AND ANAEROBIC Blood Culture adequate volume   Culture   Final    NO GROWTH < 12 HOURS Performed at [facility], [address] [place], [place] [address]    Report Status PENDING  Incomplete  MRSA Next Gen by PCR, Nasal     Status: None   Collection Time: 08/07/22  1:52 AM   Specimen: Nasal Mucosa; Nasal Swab  Result Value Ref Range Status   MRSA by PCR Next Gen NOT DETECTED NOT DETECTED Final    Comment: (NOTE) The GeneXpert MRSA Assay (FDA approved for NASAL specimens only), is one component of a comprehensive MRSA colonization surveillance program. It is not intended to diagnose MRSA infection nor to guide or monitor treatment for MRSA infections. Test performance is not FDA approved in patients less than 25 years old. Performed at [facility], [place] [address], [place], [place] [address]          Radiology Studies: DG Hand Complete Left  Result Date: 08/07/2022 CLINICAL DATA:  Infection/osteomyelitis of the left hand EXAM: LEFT HAND - COMPLETE 3+ VIEW  COMPARISON:  None Available. FINDINGS: No fracture or dislocation is seen. Expansile deformity of the 2nd metacarpal head. Mild degenerative changes of the 2nd and 3rd MCP joints. Soft tissue swelling overlying the dorsal aspect of the hand on the lateral view. On the oblique radiograph, there is cortical irregularity/destruction along the medial/ulnar aspect of the 2nd metacarpal head, suggesting osteomyelitis. IMPRESSION: Cortical irregularity/destruction along the medial/ulnar aspect of the 2nd metacarpal head, suggesting osteomyelitis. Soft tissue swelling overlying the dorsal aspect of the hand. Electronically Signed   By: Julian Hy M.D.   On: 08/07/2022 01:12        Scheduled Meds:  B-complex with vitamin C  1 tablet Oral Daily   baclofen  10 mg Oral Daily   gabapentin  300 mg Oral Q1400   gabapentin  600 mg Oral BID   heparin  5,000 Units Subcutaneous Q8H   multivitamin with minerals  1 tablet Oral Daily   simvastatin  40 mg Oral QHS   Continuous Infusions:  sodium chloride 50 mL/hr at 08/07/22 0358  ceFEPime (MAXIPIME) IV 2 g (08/07/22 0504)   vancomycin       LOS: 0 days    Time spent: 57mnutes    Doree Kuehne A Livi Mcgann, MD [facility]   If 7PM-7AM, please contact night-coverage [email or web address]  08/07/2022, 7:11 AM

## 2022-08-07 NOTE — ED Provider Notes (Signed)
Discussed with Dr. Fredna Dow.  Request hospitalization by hospitalist.  Initiate antibiotics.  Patient should be n.p.o. after 6 AM, Dr. Fredna Dow will take him to the OR in the afternoon tomorrow.   Orpah Greek, MD 08/07/22 [phone]

## 2022-08-07 NOTE — Anesthesia Procedure Notes (Signed)
Procedure Name: LMA Insertion Date/Time: 08/07/2022 2:49 PM  Performed by: Babs Bertin, CRNAPre-anesthesia Checklist: Patient identified, Emergency Drugs available, Suction available and Patient being monitored Patient Re-evaluated:Patient Re-evaluated prior to induction Oxygen Delivery Method: Circle System Utilized Preoxygenation: Pre-oxygenation with 100% oxygen Induction Type: IV induction Ventilation: Mask ventilation without difficulty LMA: LMA inserted LMA Size: 4.0 Number of attempts: 1 Airway Equipment and Method: Bite block Placement Confirmation: positive ETCO2 Tube secured with: Tape Dental Injury: Teeth and Oropharynx as per pre-operative assessment

## 2022-08-07 NOTE — ED Notes (Signed)
Patient transported to X-ray 

## 2022-08-07 NOTE — Transfer of Care (Signed)
Immediate Anesthesia Transfer of Care Note  Patient: Joshua Walter  Procedure(s) Performed: IRRIGATION AND DEBRIDEMENT HAND (Left)  Patient Location: PACU  Anesthesia Type:General  Level of Consciousness: awake and alert   Airway & Oxygen Therapy: Patient Spontanous Breathing  Post-op Assessment: Report given to RN and Post -op Vital signs reviewed and stable  Post vital signs: Reviewed and stable  Last Vitals:  Vitals Value Taken Time  BP 167/92 08/07/22 1602  Temp    Pulse 89 08/07/22 1606  Resp 16 08/07/22 1606  SpO2 92 % 08/07/22 1606  Vitals shown include unvalidated device data.  Last Pain:  Vitals:   08/07/22 1349  TempSrc: Oral  PainSc:          Complications: No notable events documented.

## 2022-08-07 NOTE — Op Note (Signed)
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   SURGEON:  Leanora Cover, M.D.   ASSISTANT: none   ANESTHESIA:  General   INTRAVENOUS FLUIDS:  Per anesthesia flow sheet.   ESTIMATED BLOOD LOSS:  Minimal.   COMPLICATIONS:  None.   SPECIMENS: Cultures to micro.  Bone cultures to micro.   TOURNIQUET TIME:    Total Tourniquet Time Documented: Upper Arm (Left) - 50 minutes Total: Upper Arm (Left) - 50 minutes    DISPOSITION:  Stable to PACU.   INDICATIONS: 71 year old male has had increasing swelling pain and erythema of the left hand over the past 3 weeks.  Radiographs and MRI show evidence of osteomyelitis of index finger metacarpal.  He has had drainage from a wound over the past day.  He wishes to proceed with incision and drainage in the operating room.  Risks, benefits and alternatives of surgery were discussed including the risks of blood loss, infection, damage to nerves, vessels, tendons, ligaments, bone for surgery, need for additional surgery, complications with wound healing, continued pain, stiffness, , need for repeat irrigation and debridement.  He voiced understanding of these risks and elected to proceed.  OPERATIVE COURSE:  After being identified preoperatively by myself,  the patient and I agreed on the procedure and site of the procedure.  The surgical site was marked.  Surgical consent had been signed.  He has been on antibiotics.  He was transferred to the operating room and placed on the operating table in supine position with the Left upper extremity on an arm board.   General anesthesia was induced by the anesthesiologist.  Left upper extremity was prepped and draped in normal sterile orthopedic fashion.  A surgical pause was performed between the surgeons, anesthesia, and operating room staff and all were in agreement as to the patient, procedure, and site of procedure.  Tourniquet at the proximal aspect of the extremity was inflated to 250 mmHg after exsanguination of the arm with an Esmarch bandage.  An incision was made on the palm of the hand between the index and long finger metacarpal heads.  This was carried in subcutaneous tissues by spreading technique.  No purulence was encountered.  Incision was made on the dorsum of the hand over the index finger metacarpal.  This was carried into subcutaneous tissues by spreading technique.  There was scar formation in the subcutaneous tissues.  There was purulence.  Cultures were taken for aerobes and anaerobes.  There was purulence coming from both the radial and ulnar sides of the index finger metacarpal.  The purulence was traced down to the index finger metacarpal head.  There was loss of bone at the radial side.  The bony cavity coursed into the metacarpal head and out on the dorsal ulnar side.  There was a bony bridge on the dorsum.  The volar aspect was intact.  The cavity then coursed to the ulnar side of the index finger metacarpal through a separate cavity into the space between the index and long finger metacarpals with purulence had collected.  This purulence tracked volar to the index finger metacarpal.  A hemostat was  placed along this purulent cavity.  Attempted the skin volarly at the base of the thenar eminence.  An additional incision was made in this location.  No gross purulence was encountered in the subcutaneous tissues.  There was purulence deep to the fascia.  The bony cavity in the index finger metacarpal was scraped with a curette.  The edges were solid bone.  The curette and rongeurs were used to scrape  the bone and any bony scrapings and bone pieces were sent to micro for cultures.  There was what appeared to be the remains of a suture in the bony cavity.  This was removed.  The rongeurs were used to debride any tissues that appeared nonviable or infected.  The skin edges where the purulence had eroded through the skin were debrided sharply with the scissors.  The abscess cavity and bone were copiously irrigated with sterile saline by cystoscopy tubing.  The abscess cavity and bone were then packed with quarter inch iodoform gauze.  The wound edges were injected with quarter percent plain Marcaine to aid in postoperative analgesia.  The wounds were dressed with sterile 4 x 4's and wrapped with a Kerlix bandage.  Volar splint was placed and wrapped with Kerlix and Ace bandage.  The tourniquet was deflated at 50 minutes.  Fingertips were pink with brisk capillary refill after deflation of tourniquet.  The operative  drapes were broken down.  The patient was awoken from anesthesia safely.  He was transferred back to the stretcher and taken to PACU in stable condition.  He is admitted to the hospitalist for IV antibiotics.  We will consult infectious disease for assistance in antibiotic management.   Leanora Cover, MD Electronically signed, 08/07/22

## 2022-08-07 NOTE — Progress Notes (Signed)
  Transition of Care [facility]) Screening Note   Patient Details  Name: Joshua Walter Date of Birth: Feb 28, 1951   Transition of Care [facility]) CM/SW Contact:    Bartholomew Crews, RN Phone Number: [phone] 08/07/2022, 7:42 AM  S/p hand abscess - pending OR for washout  Transition of Care Department [facility]) has reviewed patient and no TOC needs have been identified at this time. We will continue to monitor patient advancement through interdisciplinary progression rounds. If new patient transition needs arise, please place a TOC consult.

## 2022-08-07 NOTE — Progress Notes (Signed)
Pharmacy Antibiotic Note  Joshua Walter is a 71 y.o. male admitted on 08/06/2022 with  wound infection/osteomyelitis of the left upper extremiy .  Pharmacy has been consulted for Vancomycin/Cefepime dosing. WBC elevated. Renal function ok. Going to OR later today.   Plan: Vancomycin 1500 mg IV x 1, then 1000 mg IV q12h >>>Estimated AUC: 460 Cefepime 2g IV q8h Trend WBC, temp, renal function  F/U infectious work-up Drug levels as indicated   Height: 5' 10.5" (179.1 cm) Weight: 74.8 kg (165 lb) IBW/kg (Calculated) : 74.15  Temp (24hrs), Avg:98 F (36.7 C), Min:97.8 F (36.6 C), Max:98.2 F (36.8 C)  Recent Labs  Lab 08/06/22 1826  WBC 13.7*  CREATININE 0.68  LATICACIDVEN 0.6    Estimated Creatinine Clearance: 90.2 mL/min (by C-G formula based on SCr of 0.68 mg/dL).    No Known Allergies  Narda Bonds, PharmD, BCPS Clinical Pharmacist Phone: [phone]

## 2022-08-07 NOTE — Plan of Care (Signed)
  Problem: Education: Goal: Knowledge of General Education information will improve Description: Including pain rating scale, medication(s)/side effects and non-pharmacologic comfort measures 08/07/2022 1950 by Morene Rankins, LPN Outcome: Progressing 08/07/2022 1950 by Morene Rankins, LPN Outcome: Progressing   Problem: Health Behavior/Discharge Planning: Goal: Ability to manage health-related needs will improve 08/07/2022 1950 by Morene Rankins, LPN Outcome: Progressing 08/07/2022 1950 by Morene Rankins, LPN Outcome: Progressing   Problem: Clinical Measurements: Goal: Ability to maintain clinical measurements within normal limits will improve 08/07/2022 1950 by Morene Rankins, LPN Outcome: Progressing 08/07/2022 1950 by Morene Rankins, LPN Outcome: Progressing Goal: Will remain free from infection 08/07/2022 1950 by Morene Rankins, LPN Outcome: Progressing 08/07/2022 1950 by Morene Rankins, LPN Outcome: Progressing Goal: Diagnostic test results will improve 08/07/2022 1950 by Morene Rankins, LPN Outcome: Progressing 08/07/2022 1950 by Morene Rankins, LPN Outcome: Progressing Goal: Respiratory complications will improve 08/07/2022 1950 by Morene Rankins, LPN Outcome: Progressing 08/07/2022 1950 by Morene Rankins, LPN Outcome: Progressing Goal: Cardiovascular complication will be avoided 08/07/2022 1950 by Morene Rankins, LPN Outcome: Progressing 08/07/2022 1950 by Morene Rankins, LPN Outcome: Progressing   Problem: Activity: Goal: Risk for activity intolerance will decrease 08/07/2022 1950 by Morene Rankins, LPN Outcome: Progressing 08/07/2022 1950 by Morene Rankins, LPN Outcome: Progressing   Problem: Nutrition: Goal: Adequate nutrition will be maintained 08/07/2022 1950 by Morene Rankins, LPN Outcome: Progressing 08/07/2022 1950 by Morene Rankins, LPN Outcome: Progressing   Problem: Coping: Goal: Level of anxiety will  decrease Outcome: Progressing   Problem: Elimination: Goal: Will not experience complications related to bowel motility Outcome: Progressing Goal: Will not experience complications related to urinary retention Outcome: Progressing   Problem: Pain Managment: Goal: General experience of comfort will improve Outcome: Progressing   Problem: Safety: Goal: Ability to remain free from injury will improve Outcome: Progressing   Problem: Skin Integrity: Goal: Risk for impaired skin integrity will decrease Outcome: Progressing

## 2022-08-07 NOTE — H&P (Addendum)
History and Physical  [name] EQA:[id number] DOB: 29-Sep-1950 DOA: 08/06/2022  Referring physician: Dr. Betsey Holiday, EDP  PCP: [name], MD  Outpatient Specialists: Orthopedic surgery Patient coming from: Home through orthopedic surgery, Dr. Vanetta Shawl office.  Chief Complaint: Left hand infection and osteomyelitis    HPI: Joshua Walter is a 71 y.o. male with medical history significant for hyperlipidemia, peripheral vascular disease, prior osteomyelitis involving his right lower extremity, who presented to [facility] ED, sent from orthopedic surgery's office, Dr. Amedeo Plenty, due to concern for left hand osteomyelitis.  The patient had an x-ray done of his left hand 2 days prior, and an MRI of his left hand yesterday which confirmed osteomyelitis.    He was sent to the ED to be taken to the OR for a washout.  EDP discussed the case with orthopedic surgery on-call Dr. Fredna Dow.  Ortho requested n.p.o. after 6 AM.  Dr. Fredna Dow will take him to the OR in the afternoon on 08/07/2022.  [facility], hospitalist service, was asked to admit.  ED Course: Tmax 97.8.  BP 133/67, pulse 73, respiration rate 16, O2 saturation 98% on room air.  Lab studies remarkable for serum sodium 128, chloride 90.  WBC 13.7, hemoglobin 13.4, platelet count 26.  Review of Systems: Review of systems as noted in the HPI. All other systems reviewed and are negative.   Past Medical History:  Diagnosis Date   Cellulitis and abscess of foot, except toes    Heart murmur    Peripheral vascular disease ([place])    rt femoral occulsion   Pneumonia    hx   Past Surgical History:  Procedure Laterality Date   ABDOMINAL AORTAGRAM N/A 11/07/2012   Procedure: ABDOMINAL AORTAGRAM;  Surgeon: Serafina Mitchell, MD;  Location: [facility] CATH LAB;  Service: Cardiovascular;  Laterality: N/A;   ABDOMINAL SURGERY     2009   CERVICAL SPINE SURGERY  07   FEMORAL-TIBIAL BYPASS GRAFT Right 12/08/2012   Procedure: BYPASS GRAFT FEMORAL-TIBIAL ARTERY;  Surgeon:  Serafina Mitchell, MD;  Location: [place];  Service: Vascular;  Laterality: Right;  Ultrasound guided   HERNIA REPAIR Left 76   inguinal   SPINE SURGERY  1997   Lumbar spine    Social History:  reports that he quit smoking about 17 years ago. His smoking use included cigarettes. He has a 44.00 pack-year smoking history. He has never used smokeless tobacco. He reports current alcohol use. He reports that he does not use drugs.   No Known Allergies  Family History  Problem Relation Age of Onset   Cancer Mother       Prior to Admission medications   Medication Sig Start Date End Date Taking? Authorizing Provider  b complex vitamins capsule Take 1 capsule by mouth daily.   Yes [provider]  baclofen (LIORESAL) 10 MG tablet Take 10 mg by mouth daily.   Yes [provider]  doxycycline (VIBRA-TABS) 100 MG tablet Take 100 mg by mouth 2 (two) times daily. 08/04/22  Yes [provider]  gabapentin (NEURONTIN) 300 MG capsule Take 300-600 mg by mouth 3 (three) times daily. Take 2 capsules (600 mg) in the morning, Take 1 capsule (300 mg) at lunch and Take 2 capsules (600 mg) in the evening   Yes [provider]  losartan-hydrochlorothiazide (HYZAAR) 100-25 MG tablet Take 1 tablet by mouth daily.   Yes [provider]  Multiple Vitamin (MULTIVITAMIN WITH MINERALS) TABS Take 1 tablet by mouth daily.   Yes  [provider]  simvastatin (ZOCOR) 40 MG tablet Take 40 mg by mouth at bedtime.   Yes [provider]  traMADol (ULTRAM) 50 MG tablet Take 50 mg by mouth 2 (two) times daily as needed for moderate pain. 08/05/22  Yes [provider]    Physical Exam: BP 133/76   Pulse 76   Temp 97.8 F (36.6 C) (Oral)   Resp 18   Ht 5' 10.5" (1.791 m)   Wt 74.8 kg   SpO2 98%   BMI 23.34 kg/m   General: 71 y.o. year-old male well developed well nourished in no acute distress.  Alert and oriented x3. Cardiovascular: Regular rate and  rhythm with no rubs or gallops.  No thyromegaly or JVD noted.  No lower extremity edema. 2/4 pulses in all 4 extremities. Respiratory: Clear to auscultation with no wheezes or rales. Good inspiratory effort. Abdomen: Soft nontender nondistended with normal bowel sounds x4 quadrants. Muskuloskeletal: No cyanosis, clubbing or edema noted bilaterally Neuro: CN II-XII intact, strength, sensation, reflexes Skin: Left had dorsal portion edematous with ulcerative lesion. Psychiatry: Judgement and insight appear normal. Mood is appropriate for condition and setting          Labs on Admission:  Basic Metabolic Panel: Recent Labs  Lab 08/06/22 1826  NA 128*  K 3.7  CL 90*  CO2 25  GLUCOSE 75  BUN 8  CREATININE 0.68  CALCIUM 9.0   Liver Function Tests: Recent Labs  Lab 08/06/22 1826  AST 24  ALT 22  ALKPHOS 49  BILITOT 1.0  PROT 6.9  ALBUMIN 3.5   No results for input(s): "LIPASE", "AMYLASE" in the last 168 hours. No results for input(s): "AMMONIA" in the last 168 hours. CBC: Recent Labs  Lab 08/06/22 1826  WBC 13.7*  NEUTROABS 9.5*  HGB 13.4  HCT 38.8*  MCV 96.3  PLT 286   Cardiac Enzymes: No results for input(s): "CKTOTAL", "CKMB", "CKMBINDEX", "TROPONINI" in the last 168 hours.  BNP (last 3 results) No results for input(s): "BNP" in the last 8760 hours.  ProBNP (last 3 results) No results for input(s): "PROBNP" in the last 8760 hours.  CBG: No results for input(s): "GLUCAP" in the last 168 hours.  Radiological Exams on Admission: DG Hand Complete Left  Result Date: 08/07/2022 CLINICAL DATA:  Infection/osteomyelitis of the left hand EXAM: LEFT HAND - COMPLETE 3+ VIEW COMPARISON:  None Available. FINDINGS: No fracture or dislocation is seen. Expansile deformity of the 2nd metacarpal head. Mild degenerative changes of the 2nd and 3rd MCP joints. Soft tissue swelling overlying the dorsal aspect of the hand on the lateral view. On the oblique radiograph, there is  cortical irregularity/destruction along the medial/ulnar aspect of the 2nd metacarpal head, suggesting osteomyelitis. IMPRESSION: Cortical irregularity/destruction along the medial/ulnar aspect of the 2nd metacarpal head, suggesting osteomyelitis. Soft tissue swelling overlying the dorsal aspect of the hand. Electronically Signed   By: Julian Hy M.D.   On: 08/07/2022 01:12    EKG: I independently viewed the EKG done and my findings are as followed: None available at the time of this visit.  Assessment/Plan Present on Admission:  Hand abscess  Principal Problem:   Hand abscess  Left hand osteomyelitis, POA Seen on outpatient MRI Obtain baseline CRP and ESR Obtain MRSA screening test Follow blood cultures Monitor fever curve and WBC Continue IV vancomycin and cefepime started in ED. Plan for washout on 08/07/2022 afternoon. N.p.o. after 6 AM, per orthopedic surgery's recommendation.  Peripheral vascular disease  Hyperlipidemia Resume home Zocor  Peripheral neuropathy Resume home gabapentin     DVT prophylaxis: Subcu Lovenox daily  Code Status: Full code  Family Communication: None at bedside  Disposition Plan: Admitted to telemetry surgical unit.  Consults called: Orthopedic surgery.  Admission status: Inpatient status   Status is: Inpatient The patient requires at least 2 midnights for further evaluation and treatment of present condition.   Kayleen Memos MD [facility] Pager [phone]  If 7PM-7AM, please contact night-coverage [email or web address] Password [email or web address]  08/07/2022, 1:51 AM

## 2022-08-08 ENCOUNTER — Encounter (HOSPITAL_COMMUNITY): Payer: Self-pay | Admitting: Orthopedic Surgery

## 2022-08-08 DIAGNOSIS — L02519 Cutaneous abscess of unspecified hand: Secondary | ICD-10-CM | POA: Diagnosis not present

## 2022-08-08 LAB — BASIC METABOLIC PANEL
Anion gap: 8 (ref 5–15)
BUN: 5 mg/dL — ABNORMAL LOW (ref 8–23)
CO2: 25 mmol/L (ref 22–32)
Calcium: 8.4 mg/dL — ABNORMAL LOW (ref 8.9–10.3)
Chloride: 100 mmol/L (ref 98–111)
Creatinine, Ser: 0.67 mg/dL (ref 0.61–1.24)
GFR, Estimated: >60 mL/min (ref >60)
Glucose, Bld: 159 mg/dL — ABNORMAL HIGH (ref 70–99)
Potassium: 3.6 mmol/L (ref 3.5–5.1)
Sodium: 133 mmol/L — ABNORMAL LOW (ref 135–145)

## 2022-08-08 LAB — CBC
HCT: 37.3 % — ABNORMAL LOW (ref 39.0–52.0)
Hemoglobin: 13.1 g/dL (ref 13.0–17.0)
MCH: 33.9 pg (ref 26.0–34.0)
MCHC: 35.1 g/dL (ref 30.0–36.0)
MCV: 96.4 fL (ref 80.0–100.0)
Platelets: 283 10*3/uL (ref 150–400)
RBC: 3.87 MIL/uL — ABNORMAL LOW (ref 4.22–5.81)
RDW: 12.4 % (ref 11.5–15.5)
WBC: 10.5 10*3/uL (ref 4.0–10.5)
nRBC: 0 % (ref 0.0–0.2)

## 2022-08-08 NOTE — Consult Note (Signed)
Joshua Walter for Infectious Disease    Date of Admission:  08/06/2022   Total days of inpatient antibiotics 2        Reason for Consult: Left hand osteomyelitis    Principal Problem:   Hand abscess Active Problems:   Peripheral vascular disease, unspecified (HCC)   PVD (peripheral vascular disease) (HCC)   Hyponatremia   Assessment: 71 year old male with history of right heel osteomyelitis admitted for left hand osteomyelitis. #Left hand osteomyelitis/abscess s/p debridement on 11/18 with Dr. Fredna Dow #Reported history of right heel osteomyelitis treated with vancomycin about 10 years ago - Patient reports he had left hand swelling that started about 3 weeks ago.  He denies any trauma to the area.  He does note that he had been taking doxycycline prior to hospitalization for.  Denies fevers or chills. -X-RAy showed left 2nd metacarpal head osteomyelitis - Taken to OR for debridement. Per or note purulence was traced down to the index finger metacarpal head.  OR Cx+ Staph aureus, -Pt is amenable to PICC line if needed Recommendations:  -Continue Vancomycin and cefepime -If no other growth besides staph at 48h, would take cefepime off -Follow OR Cx -Anticipate 6 weeks of antibiotics Microbiology:   Antibiotics: Vancomycin  and cefepime 11/17-p  Cultures: OR Cx+ rare staph aureus   HPI: Joshua Walter is a 71 y.o. male with hyperlipidemia, peripheral vascular disease.  History of right heel osteomyelitis treated with vancomycin per patient, admitted for left hand osteomyelitis.  Patient was sent from Dr. Jeannie Fend, orthopedic surgery office due to concern of left hand osteomyelitis.  On arrival to the ED vitals stable taken to the OR with Dr. Fredna Dow noted that radiographs and MRI showed evidence of osteomyelitis of the index finger metacarpal.  Per or note purulence was traced down to the index finger metacarpal head.  Bony involvement noted.  Culture sent.  Patient  started on vancomycin and cefepime.  Reports he had been doxycycline after his notices swelling was and that started about 3 weeks ago.   Review of Systems: Review of Systems  All other systems reviewed and are negative.   Past Medical History:  Diagnosis Date   Cellulitis and abscess of foot, except toes    Heart murmur    Peripheral vascular disease ([place])    rt femoral occulsion   Pneumonia    hx   PVD (peripheral vascular disease) ([place]) 01/02/2013    Social History   Tobacco Use   Smoking status: Former    Packs/day: 1.00    Years: 44.00    Total pack years: 44.00    Types: Cigarettes    Quit date: 09/20/2004    Years since quitting: 17.8   Smokeless tobacco: Never   Tobacco comments:    occ alcohol  Substance Use Topics   Alcohol use: Yes   Drug use: No    Family History  Problem Relation Age of Onset   Cancer Mother    Scheduled Meds:  vitamin C  1,000 mg Oral Daily   B-complex with vitamin C  1 tablet Oral Daily   baclofen  10 mg Oral Daily   Chlorhexidine Gluconate Cloth  6 each Topical Daily   gabapentin  300 mg Oral Q1400   gabapentin  600 mg Oral BID   heparin  5,000 Units Subcutaneous Q8H   multivitamin with minerals  1 tablet Oral Daily   simvastatin  40 mg Oral QHS   Continuous  Infusions:  sodium chloride 100 mL/hr at 08/08/22 0531   ceFEPime (MAXIPIME) IV 2 g (08/08/22 0533)   lactated ringers 75 mL/hr at 08/07/22 1719   vancomycin 1,000 mg (08/08/22 0414)   PRN Meds:.acetaminophen, HYDROmorphone (DILAUDID) injection, oxyCODONE, prochlorperazine No Known Allergies  OBJECTIVE: Blood pressure 139/87, pulse 79, temperature 98.2 F (36.8 C), temperature source Oral, resp. rate 18, height 5' 10.51" (1.791 m), weight 74.8 kg, SpO2 97 %.  Physical Exam Constitutional:      General: He is not in acute distress.    Appearance: He is normal weight. He is not toxic-appearing.  HENT:     Head: Normocephalic and atraumatic.     Right Ear: External  ear normal.     Left Ear: External ear normal.     Nose: No congestion or rhinorrhea.     Mouth/Throat:     Mouth: Mucous membranes are moist.     Pharynx: Oropharynx is clear.  Eyes:     Extraocular Movements: Extraocular movements intact.     Conjunctiva/sclera: Conjunctivae normal.     Pupils: Pupils are equal, round, and reactive to light.  Cardiovascular:     Rate and Rhythm: Normal rate and regular rhythm.     Heart sounds: No murmur heard.    No friction rub. No gallop.  Pulmonary:     Effort: Pulmonary effort is normal.     Breath sounds: Normal breath sounds.  Abdominal:     General: Abdomen is flat. Bowel sounds are normal.     Palpations: Abdomen is soft.  Musculoskeletal:        General: No swelling. Normal range of motion.     Cervical back: Normal range of motion and neck supple.  Skin:    General: Skin is warm and dry.     Comments: Left hand bandaged  Neurological:     General: No focal deficit present.     Mental Status: He is oriented to person, place, and time.  Psychiatric:        Mood and Affect: Mood normal.     Lab Results Lab Results  Component Value Date   WBC 10.5 08/08/2022   HGB 13.1 08/08/2022   HCT 37.3 (L) 08/08/2022   MCV 96.4 08/08/2022   PLT 283 08/08/2022    Lab Results  Component Value Date   CREATININE 0.58 (L) 08/07/2022   BUN 7 (L) 08/07/2022   NA 134 (L) 08/07/2022   K 4.2 08/07/2022   CL 100 08/07/2022   CO2 25 08/07/2022    Lab Results  Component Value Date   ALT 22 08/06/2022   AST 24 08/06/2022   ALKPHOS 49 08/06/2022   BILITOT 1.0 08/06/2022       Laurice Record, [place] [facility] 08/08/2022, 10:43 AM

## 2022-08-08 NOTE — Progress Notes (Signed)
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  He was referred by his orthopedic to the ED for further evaluation, Dr. Annie Main.  Hand surgeon has been consulted Dr. Maryan Rued is planning to take patient to the OR on 11/18.  Patient was also found to have hyponatremia, leukocytosis with white blood cell of 13.  X-ray of the left hand showed: Cortical irregularity/destruction along the medial/ulnar aspect of the 2nd metacarpal head, suggesting osteomyelitis. Soft tissue swelling overlying the dorsal aspect of the hand.     Assessment & Plan:   Principal Problem:   Hand abscess Active Problems:   Peripheral vascular disease, unspecified ([place])   PVD (peripheral vascular disease) ([place])   Hyponatremia  1-Left Hand abscess, with osteomyelitis of index finger metacarpal:  -Hand abscess, second metacarpal head concern for osteomyelitis -Outpatient MRI: per report with osteomyelitis. I dont have report  -Continue with vancomycin and cefepime.  -Patient underwent incision and drainage of left hand including bony abscess by Dr. Fredna Dow on 11/18 -ID has been consulted  2-Hyponatremia: Suspect hypovolemic. Continue with IV fluids 3-Peripheral Vascular disease 4-Hyperlipidemia: Continue with Zocor 5-Peripheral neuropathy: Continue with gabapentin     Estimated body mass index is 23.33 kg/m as calculated from the following:   Height as of this encounter: 5' 10.51" (1.791 m).   Weight as of this encounter: 74.8 kg.   DVT prophylaxis: Heparin  Code Status: Full code Family Communication: care discussed with patient.  Disposition Plan:  Status is: Inpatient Remains inpatient appropriate  because: management hand infection     Consultants:  Dr Fredna Dow  Procedures:    Antimicrobials:  Vancomycin 11/18 Cefepime 11/18  Subjective: He report pain is controlled. He would like to eat regular diet.   Objective: Vitals:   08/07/22 1700 08/07/22 1956 08/08/22 0431 08/08/22 0739  BP: (!) 163/95 114/76 (!) 161/93 139/87  Pulse: 84   79  Resp: '17  18 18  '$ Temp: 98.6 F (37 C) 98.3 F (36.8 C) 98.6 F (37 C) 98.2 F (36.8 C)  TempSrc: Oral Oral Oral Oral  SpO2: 95% 95% 97% 97%  Weight:      Height:        Intake/Output Summary (Last 24 hours) at 08/08/2022 1019 Last data filed at 08/07/2022 1606 Gross per 24 hour  Intake 600 ml  Output 5 ml  Net 595 ml    Filed Weights   08/06/22 1802 08/07/22 1349  Weight: 74.8 kg 74.8 kg    Examination:  General exam: NAD Respiratory system: CTA Cardiovascular system: S 1, S 2 RRR Gastrointestinal system: BS present, soft, nt Central nervous system: Alert, follows command Extremities: Left hand with dressing.   Data Reviewed: I have personally reviewed following labs and imaging studies  CBC: Recent Labs  Lab 08/06/22 1826 08/07/22 0716 08/08/22 0252  WBC 13.7* 9.5 10.5  NEUTROABS 9.5* 6.6  --   HGB 13.4 13.6 13.1  HCT 38.8* 38.9* 37.3*  MCV 96.3 95.6 96.4  PLT 286 283 163    Basic Metabolic Panel: Recent Labs  Lab 08/06/22 1826 08/07/22 0716  NA 128* 134*  K 3.7 4.2  CL 90* 100  CO2 25 25  GLUCOSE 75 108*  BUN 8 7*  CREATININE 0.68 0.58*  CALCIUM  9.0 8.9  MG  --  1.8  PHOS  --  2.6    GFR: Estimated Creatinine Clearance: 90.2 mL/min (A) (by C-G formula based on SCr of 0.58 mg/dL (L)). Liver Function Tests: Recent Labs  Lab 08/06/22 1826  AST 24  ALT 22  ALKPHOS 49  BILITOT 1.0  PROT 6.9  ALBUMIN 3.5    No results for input(s): "LIPASE", "AMYLASE" in the last 168 hours. No results for input(s): "AMMONIA" in the last 168 hours. Coagulation Profile: Recent Labs  Lab  08/06/22 1826  INR 1.0    Cardiac Enzymes: No results for input(s): "CKTOTAL", "CKMB", "CKMBINDEX", "TROPONINI" in the last 168 hours. BNP (last 3 results) No results for input(s): "PROBNP" in the last 8760 hours. HbA1C: No results for input(s): "HGBA1C" in the last 72 hours. CBG: No results for input(s): "GLUCAP" in the last 168 hours. Lipid Profile: No results for input(s): "CHOL", "HDL", "LDLCALC", "TRIG", "CHOLHDL", "LDLDIRECT" in the last 72 hours. Thyroid Function Tests: No results for input(s): "TSH", "T4TOTAL", "FREET4", "T3FREE", "THYROIDAB" in the last 72 hours. Anemia Panel: No results for input(s): "VITAMINB12", "FOLATE", "FERRITIN", "TIBC", "IRON", "RETICCTPCT" in the last 72 hours. Sepsis Labs: Recent Labs  Lab 08/06/22 1826  LATICACIDVEN 0.6     Recent Results (from the past 240 hour(s))  Culture, blood (Routine x 2)     Status: None (Preliminary result)   Collection Time: 08/06/22  6:26 PM   Specimen: BLOOD  Result Value Ref Range Status   Specimen Description BLOOD RIGHT ANTECUBITAL  Final   Special Requests   Final    BOTTLES DRAWN AEROBIC AND ANAEROBIC Blood Culture adequate volume   Culture   Final    NO GROWTH 2 DAYS Performed at [facility], [address], [place], [place] [address]    Report Status PENDING  Incomplete  Culture, blood (Routine x 2)     Status: None (Preliminary result)   Collection Time: 08/07/22  1:35 AM   Specimen: BLOOD LEFT FOREARM  Result Value Ref Range Status   Specimen Description BLOOD LEFT FOREARM  Final   Special Requests   Final    BOTTLES DRAWN AEROBIC AND ANAEROBIC Blood Culture adequate volume   Culture   Final    NO GROWTH 1 DAY Performed at [facility], [place] [address], [place], [place] [address]    Report Status PENDING  Incomplete  MRSA Next Gen by PCR, Nasal     Status: None   Collection Time: 08/07/22  1:52 AM   Specimen: Nasal Mucosa; Nasal Swab  Result Value Ref Range Status   MRSA by  PCR Next Gen NOT DETECTED NOT DETECTED Final    Comment: (NOTE) The GeneXpert MRSA Assay (FDA approved for NASAL specimens only), is one component of a comprehensive MRSA colonization surveillance program. It is not intended to diagnose MRSA infection nor to guide or monitor treatment for MRSA infections. Test performance is not FDA approved in patients less than 48 years old. Performed at [facility], [place] [address], [place], [place] [address]   Aerobic/Anaerobic Culture w Gram Stain (surgical/deep wound)     Status: None (Preliminary result)   Collection Time: 08/07/22  3:03 PM   Specimen: PATH Other; Tissue  Result Value Ref Range Status   Specimen Description TISSUE  Final   Special Requests LEFT HAND, VANC  Final   Gram Stain   Final    RARE WBC PRESENT, PREDOMINANTLY PMN NO ORGANISMS SEEN  Culture   Final    RARE STAPHYLOCOCCUS AUREUS SUSCEPTIBILITIES TO FOLLOW Performed at [facility], [place] [address] [place], [place] [address]    Report Status PENDING  Incomplete  Aerobic/Anaerobic Culture w Gram Stain (surgical/deep wound)     Status: None (Preliminary result)   Collection Time: 08/07/22  3:16 PM   Specimen: PATH Bone resection; Tissue  Result Value Ref Range Status   Specimen Description BONE  Final   Special Requests LEFT INDEX METACARPAL, VANC  Final   Gram Stain NO ORGANISMS SEEN NO WBC SEEN   Final   Culture   Final    CULTURE REINCUBATED FOR BETTER GROWTH Performed at [facility], [address], [place], [place] [address]    Report Status PENDING  Incomplete         Radiology Studies: DG Hand Complete Left  Result Date: 08/07/2022 CLINICAL DATA:  Infection/osteomyelitis of the left hand EXAM: LEFT HAND - COMPLETE 3+ VIEW COMPARISON:  None Available. FINDINGS: No fracture or dislocation is seen. Expansile deformity of the 2nd metacarpal head. Mild degenerative changes of the 2nd and 3rd MCP joints. Soft tissue swelling overlying  the dorsal aspect of the hand on the lateral view. On the oblique radiograph, there is cortical irregularity/destruction along the medial/ulnar aspect of the 2nd metacarpal head, suggesting osteomyelitis. IMPRESSION: Cortical irregularity/destruction along the medial/ulnar aspect of the 2nd metacarpal head, suggesting osteomyelitis. Soft tissue swelling overlying the dorsal aspect of the hand. Electronically Signed   By: Julian Hy M.D.   On: 08/07/2022 01:12        Scheduled Meds:  vitamin C  1,000 mg Oral Daily   B-complex with vitamin C  1 tablet Oral Daily   baclofen  10 mg Oral Daily   Chlorhexidine Gluconate Cloth  6 each Topical Daily   gabapentin  300 mg Oral Q1400   gabapentin  600 mg Oral BID   heparin  5,000 Units Subcutaneous Q8H   multivitamin with minerals  1 tablet Oral Daily   simvastatin  40 mg Oral QHS   Continuous Infusions:  sodium chloride 100 mL/hr at 08/08/22 0531   ceFEPime (MAXIPIME) IV 2 g (08/08/22 0533)   lactated ringers 75 mL/hr at 08/07/22 1719   vancomycin 1,000 mg (08/08/22 0414)     LOS: 1 day    Time spent: [place], MD [facility]   If 7PM-7AM, please contact night-coverage [email or web address]  08/08/2022, 10:19 AM

## 2022-08-08 NOTE — Plan of Care (Signed)

## 2022-08-09 ENCOUNTER — Inpatient Hospital Stay: Payer: Self-pay

## 2022-08-09 DIAGNOSIS — L02519 Cutaneous abscess of unspecified hand: Secondary | ICD-10-CM | POA: Diagnosis not present

## 2022-08-09 LAB — CBC
HCT: 41.9 % (ref 39.0–52.0)
Hemoglobin: 14.1 g/dL (ref 13.0–17.0)
MCH: 33.1 pg (ref 26.0–34.0)
MCHC: 33.7 g/dL (ref 30.0–36.0)
MCV: 98.4 fL (ref 80.0–100.0)
Platelets: 261 10*3/uL (ref 150–400)
RBC: 4.26 MIL/uL (ref 4.22–5.81)
RDW: 12.3 % (ref 11.5–15.5)
WBC: 7.9 10*3/uL (ref 4.0–10.5)
nRBC: 0 % (ref 0.0–0.2)

## 2022-08-09 MED ORDER — SODIUM CHLORIDE 0.9% FLUSH: 10.0000 mL | Freq: Two times a day (BID) | INTRAVENOUS | Status: DC

## 2022-08-09 MED ORDER — SODIUM CHLORIDE 0.9% FLUSH
10.0000 mL | INTRAVENOUS | Status: DC | PRN
  Administered 2022-08-10: 10 mL

## 2022-08-09 NOTE — Evaluation (Signed)
Physical Therapy Evaluation and Discharge  Patient Details Name: Joshua Walter MRN: [id number] DOB: Jun 01, 1951 Today's Date: 08/09/2022  History of Present Illness  Pt is a 71 y/o male who presents 11/17 withswelling, pain, and erythema in the L hand progressing over the past 2 weeks. MRI revealed osteomyelitis of the L index finger metacarpal. He is now s/p I&D on 11/18. PMH significant for osteomyelitis of R foot, PVD, cervical spine surgery, fem-tibial bypass graft, lumbar spine surgery.   Clinical Impression  Patient evaluated by Physical Therapy with no further acute PT needs identified. All education has been completed and the patient has no further questions. At the time of PT eval pt was managing independently in room, unplugging IV pole to go to the bathroom. Pt ambulated independently in the hallway and do not anticipate any further acute PT needs at this time. See below for any follow-up Physical Therapy or equipment needs. PT is signing off. Thank you for this referral.        Recommendations for follow up therapy are one component of a multi-disciplinary discharge planning process, led by the attending physician.  Recommendations may be updated based on patient status, additional functional criteria and insurance authorization.  Follow Up Recommendations No PT follow up      Assistance Recommended at Discharge PRN  Patient can return home with the following       Equipment Recommendations None recommended by PT  Recommendations for Other Services       Functional Status Assessment Patient has not had a recent decline in their functional status     Precautions / Restrictions Precautions Precautions: None Restrictions Weight Bearing Restrictions: No      Mobility  Bed Mobility Overal bed mobility: Independent                  Transfers Overall transfer level: Independent Equipment used: None                     Ambulation/Gait Ambulation/Gait assistance: Independent Gait Distance (Feet): 510 Feet Assistive device: None Gait Pattern/deviations: WFL(Within Functional Limits)       General Gait Details: Decreased speed but overall steady without unsteadiness or LOB.  Stairs            Wheelchair Mobility    Modified Rankin (Stroke Patients Only)       Balance Overall balance assessment: Independent                                           Pertinent Vitals/Pain Pain Assessment Pain Assessment: Faces Faces Pain Scale: Hurts a little bit    Home Living Family/patient expects to be discharged to:: Private residence Living Arrangements: Spouse/significant other Available Help at Discharge: Family;Available 24 hours/day Type of Home: House Home Access: Level entry       Home Layout: One level Home Equipment: Conservation officer, nature (2 wheels);Grab bars - tub/shower;Grab bars - toilet      Prior Function Prior Level of Function : Independent/Modified Independent                     Hand Dominance   Dominant Hand: Right    Extremity/Trunk Assessment   Upper Extremity Assessment Upper Extremity Assessment: LUE deficits/detail LUE Deficits / Details: Pt with baseline decreased extension of the middle finger. Currently with limited index finger flexion. LUE Sensation:  WNL LUE Coordination: decreased fine motor    Lower Extremity Assessment Lower Extremity Assessment: Overall WFL for tasks assessed    Cervical / Trunk Assessment Cervical / Trunk Assessment: Normal  Communication   Communication: No difficulties  Cognition Arousal/Alertness: Awake/alert Behavior During Therapy: WFL for tasks assessed/performed Overall Cognitive Status: Within Functional Limits for tasks assessed                                          General Comments      Exercises     Assessment/Plan    PT Assessment Patient does not need any  further PT services  PT Problem List         PT Treatment Interventions      PT Goals (Current goals can be found in the Care Plan section)  Acute Rehab PT Goals Patient Stated Goal: Home ASAP PT Goal Formulation: All assessment and education complete, DC therapy    Frequency       Co-evaluation               AM-PAC PT "6 Clicks" Mobility  Outcome Measure Help needed turning from your back to your side while in a flat bed without using bedrails?: None Help needed moving from lying on your back to sitting on the side of a flat bed without using bedrails?: None Help needed moving to and from a bed to a chair (including a wheelchair)?: None Help needed standing up from a chair using your arms (e.g., wheelchair or bedside chair)?: None Help needed to walk in hospital room?: None Help needed climbing 3-5 steps with a railing? : None 6 Click Score: 24    End of Session   Activity Tolerance: Patient tolerated treatment well Patient left: in bed;with call bell/phone within reach Nurse Communication: Mobility status PT Visit Diagnosis: Pain Pain - Right/Left: Left Pain - part of body: Hand    Time: 2919-1660 PT Time Calculation (min) (ACUTE ONLY): 14 min   Charges:   PT Evaluation $PT Eval Low Complexity: 1 Low          Rolinda Roan, PT, DPT Acute Rehabilitation Services Secure Chat Preferred Office: [phone]   Thelma Comp 08/09/2022, 3:59 PM

## 2022-08-09 NOTE — Progress Notes (Signed)
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  He was referred by his orthopedic to the ED for further evaluation, Dr. Annie Main.  Hand surgeon has been consulted Dr. Maryan Rued is planning to take patient to the OR on 11/18.  Patient was also found to have hyponatremia, leukocytosis with white blood cell of 13.  X-ray of the left hand showed: Cortical irregularity/destruction along the medial/ulnar aspect of the 2nd metacarpal head, suggesting osteomyelitis. Soft tissue swelling overlying the dorsal aspect of the hand.     Assessment & Plan:   Principal Problem:   Hand abscess Active Problems:   Peripheral vascular disease, unspecified ([place])   PVD (peripheral vascular disease) ([place])   Hyponatremia  1-Left Hand abscess, with osteomyelitis of index finger metacarpal:  -Hand abscess, second metacarpal head concern for osteomyelitis -Outpatient MRI: per report with osteomyelitis. I dont have report  -Continue with vancomycin and cefepime.  -Patient underwent incision and drainage of left hand including bony abscess by Dr. Fredna Dow on 11/18 -ID has been consulted. He will need 6 weeks IV antibiotics. Await recommendation from ID, plan for Picc line placement.  -Wound culture; growing MRSA  2-Hyponatremia: Suspect hypovolemic. Continue with IV fluids 3-Peripheral Vascular disease 4-Hyperlipidemia: Continue with Zocor 5-Peripheral neuropathy: Continue with gabapentin     Estimated body mass index is 23.33 kg/m as calculated from the following:   Height as of this encounter: 5' 10.51" (1.791 m).   Weight as of this encounter: 74.8 kg.   DVT prophylaxis: Heparin  Code Status: Full  code Family Communication: care discussed with patient.  Disposition Plan:  Status is: Inpatient Remains inpatient appropriate because: management hand infection     Consultants:  Dr Fredna Dow  Procedures:    Antimicrobials:  Vancomycin 11/18 Cefepime 11/18  Subjective: Having hand pain. Will need pain meds. No new complaints.   Objective: Vitals:   08/08/22 0739 08/08/22 1606 08/08/22 2052 08/09/22 0416  BP: 139/87 (!) 144/75 138/82 (!) 150/90  Pulse: 79 78 80 64  Resp: '18 17 19 17  '$ Temp: 98.2 F (36.8 C) (!) 97.4 F (36.3 C) 98 F (36.7 C) 98 F (36.7 C)  TempSrc: Oral Oral Oral Oral  SpO2: 97% 100% 100% 100%  Weight:      Height:        Intake/Output Summary (Last 24 hours) at 08/09/2022 1235 Last data filed at 08/08/2022 1608 Gross per 24 hour  Intake 240 ml  Output --  Net 240 ml    Filed Weights   08/06/22 1802 08/07/22 1349  Weight: 74.8 kg 74.8 kg    Examination:  General exam: NAD Respiratory system: CTA Cardiovascular system: S 1 S 2 RRR Gastrointestinal system: BS present, Soft, nt Central nervous system: Alert, follows command Extremities: Left hand with dressing.   Data Reviewed: I have personally reviewed following labs and imaging studies  CBC: Recent Labs  Lab 08/06/22 1826 08/07/22 0716 08/08/22 0252 08/09/22 0730  WBC 13.7* 9.5 10.5 7.9  NEUTROABS 9.5* 6.6  --   --   HGB 13.4 13.6 13.1 14.1  HCT 38.8* 38.9* 37.3* 41.9  MCV 96.3 95.6 96.4 98.4  PLT 286 283 283 491    Basic Metabolic Panel: Recent Labs  Lab 08/06/22 1826 08/07/22 0716 08/08/22  1111  NA 128* 134* 133*  K 3.7 4.2 3.6  CL 90* 100 100  CO2 '25 25 25  '$ GLUCOSE 75 108* 159*  BUN 8 7* 5*  CREATININE 0.68 0.58* 0.67  CALCIUM 9.0 8.9 8.4*  MG  --  1.8  --   PHOS  --  2.6  --     GFR: Estimated Creatinine Clearance: 90.2 mL/min (by C-G formula based on SCr of 0.67 mg/dL). Liver Function Tests: Recent Labs  Lab 08/06/22 1826  AST 24  ALT 22  ALKPHOS  49  BILITOT 1.0  PROT 6.9  ALBUMIN 3.5    No results for input(s): "LIPASE", "AMYLASE" in the last 168 hours. No results for input(s): "AMMONIA" in the last 168 hours. Coagulation Profile: Recent Labs  Lab 08/06/22 1826  INR 1.0    Cardiac Enzymes: No results for input(s): "CKTOTAL", "CKMB", "CKMBINDEX", "TROPONINI" in the last 168 hours. BNP (last 3 results) No results for input(s): "PROBNP" in the last 8760 hours. HbA1C: No results for input(s): "HGBA1C" in the last 72 hours. CBG: No results for input(s): "GLUCAP" in the last 168 hours. Lipid Profile: No results for input(s): "CHOL", "HDL", "LDLCALC", "TRIG", "CHOLHDL", "LDLDIRECT" in the last 72 hours. Thyroid Function Tests: No results for input(s): "TSH", "T4TOTAL", "FREET4", "T3FREE", "THYROIDAB" in the last 72 hours. Anemia Panel: No results for input(s): "VITAMINB12", "FOLATE", "FERRITIN", "TIBC", "IRON", "RETICCTPCT" in the last 72 hours. Sepsis Labs: Recent Labs  Lab 08/06/22 1826  LATICACIDVEN 0.6     Recent Results (from the past 240 hour(s))  Culture, blood (Routine x 2)     Status: None (Preliminary result)   Collection Time: 08/06/22  6:26 PM   Specimen: BLOOD  Result Value Ref Range Status   Specimen Description BLOOD RIGHT ANTECUBITAL  Final   Special Requests   Final    BOTTLES DRAWN AEROBIC AND ANAEROBIC Blood Culture adequate volume   Culture   Final    NO GROWTH 3 DAYS Performed at [facility], [address], [place], [place] [address]    Report Status PENDING  Incomplete  Culture, blood (Routine x 2)     Status: None (Preliminary result)   Collection Time: 08/07/22  1:35 AM   Specimen: BLOOD LEFT FOREARM  Result Value Ref Range Status   Specimen Description BLOOD LEFT FOREARM  Final   Special Requests   Final    BOTTLES DRAWN AEROBIC AND ANAEROBIC Blood Culture adequate volume   Culture   Final    NO GROWTH 2 DAYS Performed at [facility], [place] [address],  [place], [place] [address]    Report Status PENDING  Incomplete  MRSA Next Gen by PCR, Nasal     Status: None   Collection Time: 08/07/22  1:52 AM   Specimen: Nasal Mucosa; Nasal Swab  Result Value Ref Range Status   MRSA by PCR Next Gen NOT DETECTED NOT DETECTED Final    Comment: (NOTE) The GeneXpert MRSA Assay (FDA approved for NASAL specimens only), is one component of a comprehensive MRSA colonization surveillance program. It is not intended to diagnose MRSA infection nor to guide or monitor treatment for MRSA infections. Test performance is not FDA approved in patients less than 60 years old. Performed at [facility], [place] [address] [place], [place] [address]   Aerobic/Anaerobic Culture w Gram Stain (surgical/deep wound)     Status: None (Preliminary result)   Collection Time: 08/07/22  3:03 PM   Specimen:  PATH Other; Tissue  Result Value Ref Range Status   Specimen Description TISSUE  Final   Special Requests LEFT HAND, VANC  Final   Gram Stain   Final    RARE WBC PRESENT, PREDOMINANTLY PMN NO ORGANISMS SEEN Performed at [facility], [address] [place], [place] [address]    Culture   Final    RARE METHICILLIN RESISTANT STAPHYLOCOCCUS AUREUS NO ANAEROBES ISOLATED; CULTURE IN PROGRESS FOR 5 DAYS    Report Status PENDING  Incomplete   Organism ID, Bacteria METHICILLIN RESISTANT STAPHYLOCOCCUS AUREUS  Final      Susceptibility   Methicillin resistant staphylococcus aureus - MIC*    CIPROFLOXACIN <=0.5 SENSITIVE Sensitive     ERYTHROMYCIN >=8 RESISTANT Resistant     GENTAMICIN <=0.5 SENSITIVE Sensitive     OXACILLIN >=4 RESISTANT Resistant     TETRACYCLINE <=1 SENSITIVE Sensitive     VANCOMYCIN 1 SENSITIVE Sensitive     TRIMETH/SULFA <=10 SENSITIVE Sensitive     CLINDAMYCIN <=0.25 SENSITIVE Sensitive     RIFAMPIN <=0.5 SENSITIVE Sensitive     Inducible Clindamycin NEGATIVE Sensitive     * RARE METHICILLIN RESISTANT STAPHYLOCOCCUS AUREUS  Aerobic/Anaerobic  Culture w Gram Stain (surgical/deep wound)     Status: None (Preliminary result)   Collection Time: 08/07/22  3:16 PM   Specimen: PATH Bone resection; Tissue  Result Value Ref Range Status   Specimen Description BONE  Final   Special Requests LEFT INDEX METACARPAL, VANC  Final   Gram Stain   Final    NO ORGANISMS SEEN NO WBC SEEN Performed at [facility], [address], [place], [place] [address]    Culture   Final    RARE STAPHYLOCOCCUS AUREUS SUSCEPTIBILITIES TO FOLLOW CRITICAL RESULT CALLED TO, READ BACK BY AND VERIFIED WITH: RN MARINA.S AT 6283 ON 08/09/2022 BY T.SAAD. NO ANAEROBES ISOLATED; CULTURE IN PROGRESS FOR 5 DAYS    Report Status PENDING  Incomplete         Radiology Studies: No results found.      Scheduled Meds:  vitamin C  1,000 mg Oral Daily   B-complex with vitamin C  1 tablet Oral Daily   baclofen  10 mg Oral Daily   Chlorhexidine Gluconate Cloth  6 each Topical Daily   gabapentin  300 mg Oral Q1400   gabapentin  600 mg Oral BID   heparin  5,000 Units Subcutaneous Q8H   multivitamin with minerals  1 tablet Oral Daily   simvastatin  40 mg Oral QHS   Continuous Infusions:  lactated ringers 75 mL/hr at 08/07/22 1719   vancomycin 1,000 mg (08/09/22 0634)     LOS: 2 days    Time spent: [place], MD [facility]   If 7PM-7AM, please contact night-coverage [email or web address]  08/09/2022, 12:35 PM

## 2022-08-09 NOTE — Progress Notes (Signed)
[facility] for Infectious Disease    Date of Admission:  08/06/2022   Total days of antibiotics 4/vanco and cefepime         ID: Joshua Walter is a 71 y.o. male with MRSA left hand abscess/with osteo of index finger metacarpal Principal Problem:   Hand abscess Active Problems:   Peripheral vascular disease, unspecified (HCC)   PVD (peripheral vascular disease) (HCC)   Hyponatremia    Subjective: Doing PT exercises of left hand without difficulty. Afebrile.  Medications:   vitamin C  1,000 mg Oral Daily   B-complex with vitamin C  1 tablet Oral Daily   baclofen  10 mg Oral Daily   Chlorhexidine Gluconate Cloth  6 each Topical Daily   gabapentin  300 mg Oral Q1400   gabapentin  600 mg Oral BID   heparin  5,000 Units Subcutaneous Q8H   multivitamin with minerals  1 tablet Oral Daily   simvastatin  40 mg Oral QHS    Objective: Vital signs in last 24 hours: Temp:  [97.4 F (36.3 C)-98 F (36.7 C)] 98 F (36.7 C) (11/20 0416) Pulse Rate:  [64-80] 64 (11/20 0416) Resp:  [17-19] 17 (11/20 0416) BP: (138-150)/(75-90) 150/90 (11/20 0416) SpO2:  [100 %] 100 % (11/20 0416)  Physical Exam  Constitutional: He is oriented to person, place, and time. He appears well-developed and well-nourished. No distress.  HENT:  Mouth/Throat: Oropharynx is clear and moist. No oropharyngeal exudate.  Cardiovascular: Normal rate, regular rhythm and normal heart sounds. Exam reveals no gallop and no friction rub.  No murmur heard.  Pulmonary/Chest: Effort normal and breath sounds normal. No respiratory distress. He has no wheezes.  Abdominal: Soft. Bowel sounds are normal. He exhibits no distension. There is no tenderness.  Ext: left hand wrapped Neurological: He is alert and oriented to person, place, and time.  Skin: Skin is warm and dry. No rash noted. No erythema.  Psychiatric: He has a normal mood and affect. His behavior is normal.    Lab Results Recent Labs    08/07/22 0716  08/08/22 0252 08/08/22 1111 08/09/22 0730  WBC 9.5 10.5  --  7.9  HGB 13.6 13.1  --  14.1  HCT 38.9* 37.3*  --  41.9  NA 134*  --  133*  --   K 4.2  --  3.6  --   CL 100  --  100  --   CO2 25  --  25  --   BUN 7*  --  5*  --   CREATININE 0.58*  --  0.67  --    Liver Panel Recent Labs    08/06/22 1826  PROT 6.9  ALBUMIN 3.5  AST 24  ALT 22  ALKPHOS 49  BILITOT 1.0   Sedimentation Rate Recent Labs    08/07/22 0716  ESRSEDRATE 26*   C-Reactive Protein Recent Labs    08/07/22 0716  CRP 3.1*    Microbiology: Methicillin resistant staphylococcus aureus      MIC    CIPROFLOXACIN <=0.5 SENSI... Sensitive    CLINDAMYCIN <=0.25 SENS... Sensitive    ERYTHROMYCIN >=8 RESISTANT Resistant    GENTAMICIN <=0.5 SENSI... Sensitive    Inducible Clindamycin NEGATIVE Sensitive    OXACILLIN >=4 RESISTANT Resistant    RIFAMPIN <=0.5 SENSI... Sensitive    TETRACYCLINE <=1 SENSITIVE Sensitive    TRIMETH/SULFA <=10 SENSIT... Sensitive    VANCOMYCIN 1 SENSITIVE Sensitive    Studies/Results: [place] EKG SITE RITE  Result Date: 08/09/2022 If Site Rite image not attached, placement could not be confirmed due to current cardiac rhythm.   Soft tissue swelling overlying the dorsal aspect of the hand on the lateral view.   On the oblique radiograph, there is cortical irregularity/destruction along the medial/ulnar aspect of the 2nd metacarpal head, suggesting osteomyelitis.   IMPRESSION: Cortical irregularity/destruction along the medial/ulnar aspect of the 2nd metacarpal head, suggesting osteomyelitis.    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  - discussed with patient that we will do IV abtx for 4 wk then transition to oral abtx  - plan to get picc line placed today - will arrange home health orders and follow up appt --------------------------- Diagnosis: MRSA osteo of hand  Culture Result: MRSA  No Known Allergies  OPAT Orders Discharge antibiotics to be  given via PICC line Discharge antibiotics: Per pharmacy protocol : vancomycin Aim for Vancomycin trough 15-20 or AUC 400-550 (unless otherwise indicated) Duration: 4 wk End Date:dec 15th  [facility] Care Per Protocol:  Home health RN for IV administration and teaching; PICC line care and labs.    Labs weekly while on IV antibiotics: _x_ CBC with differential _x_ BMP  _x_ Vancomycin trough __x CRP and sed Rate  __x Please pull PIC at completion of IV antibiotics  Fax weekly labs to [phone]  Clinic Follow Up Appt: In  3-4 wk  @ [facility] with Layden Caterino   [facility] for Infectious Diseases Pager: [phone]  08/09/2022, 2:39 PM

## 2022-08-09 NOTE — TOC Initial Note (Addendum)
Transition of Care [facility]) - Initial/Assessment Note    Patient Details  Name: Joshua Walter MRN: [id number] Date of Birth: 1950/12/08  Transition of Care [facility]) CM/SW Contact:    Bartholomew Crews, RN Phone Number: [phone] 08/09/2022, 12:13 PM  Clinical Narrative:                  Spoke with patient on hospital room phone to discuss post acute transition. Physical address is [address] [place], [place] [address]. Discussed need for 6 weeks IV antibiotics. He stated that he has to wait 48 hours to get PICC line which would be late afternoon/evening today. Offered choice of home health agency - no preference. Referral pending with [facility]. Patient will need [facility] RN Face to Face orders at dc.  [facility] received referral from ID for IV antibiotics and will provide teaching. Patient has transportation home at discharge. TOC to follow for transition needs.   UPDATE: Alvis Lemmings confirmed that they accept referral for [facility] RN.   Expected Discharge Plan: [place] Barriers to Discharge: Continued Medical Work up   Patient Goals and CMS Choice Patient states their goals for this hospitalization and ongoing recovery are:: return home CMS Medicare.gov Compare Post Acute Care list provided to:: Patient Choice offered to / list presented to : Patient  Expected Discharge Plan and Services Expected Discharge Plan: [place]   Discharge Planning Services: NA Post Acute Care Choice: Durable Medical Equipment, Home Health                   DME Arranged: N/A DME Agency: NA       HH Arranged: RN, IV Antibiotics HH Agency: [facility], [facility] Date [place]: 08/09/22 Time HH Agency Contacted: 1212 Representative spoke with at [place]: Tommi Rumps and Jeannene Patella  Prior Living Arrangements/Services   Lives with:: Self, Spouse Patient language and need for interpreter reviewed:: Yes Do you feel safe going back to the place where you live?: Yes       Need for Family Participation in Patient Care: No (Comment)     Criminal Activity/Legal Involvement Pertinent to Current Situation/Hospitalization: No - Comment as needed  Activities of Daily Living Home Assistive Devices/Equipment: Cane (specify quad or straight), Dentures (specify type) ADL Screening (condition at time of admission) Patient's cognitive ability adequate to safely complete daily activities?: Yes Is the patient deaf or have difficulty hearing?: No Does the patient have difficulty seeing, even when wearing glasses/contacts?: No Does the patient have difficulty concentrating, remembering, or making decisions?: No Patient able to express need for assistance with ADLs?: Yes Does the patient have difficulty dressing or bathing?: No Independently performs ADLs?: Yes (appropriate for developmental age) Does the patient have difficulty walking or climbing stairs?: No Weakness of Legs: None Weakness of Arms/Hands: None  Permission Sought/Granted                  Emotional Assessment Appearance:: Appears stated age Attitude/Demeanor/Rapport: Engaged Affect (typically observed): Accepting Orientation: : Oriented to Self, Oriented to Place, Oriented to  Time, Oriented to Situation Alcohol / Substance Use: Not Applicable Psych Involvement: No (comment)  Admission diagnosis:  Hand abscess [L02.519] Patient Active Problem List   Diagnosis Date Noted   Hand abscess 08/07/2022   Hyponatremia 08/07/2022   PVD (peripheral vascular disease) ([place]) 01/02/2013   Occlusion and stenosis of carotid artery without mention of cerebral infarction 11/20/2012   Atherosclerosis of native arteries of  the extremities with ulceration(440.23) 10/30/2012   Peripheral vascular disease, unspecified ([place]) 10/30/2012   Ulcer of heel and midfoot ([place]) 10/30/2012   PCP:  [name], MD Pharmacy:   [facility] [address] [place] [address] [place]  [address] Phone: [phone] Fax: [phone]     Social Determinants of Health (SDOH) Interventions    Readmission Risk Interventions     No data to display

## 2022-08-09 NOTE — Progress Notes (Signed)
Subjective: 2 Days Post-Op Procedure(s) (LRB): IRRIGATION AND DEBRIDEMENT HAND (Left) Patient states he has some pain in the hand.  This controlled with pain medications.  He is anxious to be discharged home.  Objective: Vital signs in last 24 hours: Temp:  [97.4 F (36.3 C)-98 F (36.7 C)] 98 F (36.7 C) (11/20 0416) Pulse Rate:  [64-80] 64 (11/20 0416) Resp:  [17-19] 17 (11/20 0416) BP: (138-150)/(75-90) 150/90 (11/20 0416) SpO2:  [100 %] 100 % (11/20 0416)  Intake/Output from previous day: 11/19 0701 - 11/20 0700 In: 240 [P.O.:240] Out: -  Intake/Output this shift: No intake/output data recorded.  Recent Labs    08/06/22 1826 08/07/22 0716 08/08/22 0252 08/09/22 0730  HGB 13.4 13.6 13.1 14.1   Recent Labs    08/08/22 0252 08/09/22 0730  WBC 10.5 7.9  RBC 3.87* 4.26  HCT 37.3* 41.9  PLT 283 261   Recent Labs    08/07/22 0716 08/08/22 1111  NA 134* 133*  K 4.2 3.6  CL 100 100  CO2 25 25  BUN 7* 5*  CREATININE 0.58* 0.67  GLUCOSE 108* 159*  CALCIUM 8.9 8.4*   Recent Labs    08/06/22 1826  INR 1.0    Intact sensation capillary refill all fingertips.  Can extend the fingers.  Dressing is clean dry intact.   Assessment/Plan: 2 Days Post-Op Procedure(s) (LRB): IRRIGATION AND DEBRIDEMENT HAND (Left) Doing well postop.  Stable for discharge home from hand standpoint.  He is awaiting a PICC line and planning for IV antibiotics.  I will see him in the office this week after his discharge.  We can start hydrotherapy there.   Leanora Cover 08/09/2022, 3:14 PM

## 2022-08-09 NOTE — Progress Notes (Signed)
Peripherally Inserted Central Catheter Placement  The IV Nurse has discussed with the patient and/or persons authorized to consent for the patient, the purpose of this procedure and the potential benefits and risks involved with this procedure.  The benefits include less needle sticks, lab draws from the catheter, and the patient may be discharged home with the catheter. Risks include, but not limited to, infection, bleeding, blood clot (thrombus formation), and puncture of an artery; nerve damage and irregular heartbeat and possibility to perform a PICC exchange if needed/ordered by physician.  Alternatives to this procedure were also discussed.  Bard Power PICC patient education guide, fact sheet on infection prevention and patient information card has been provided to patient /or left at bedside.    PICC Placement Documentation  PICC Single Lumen 08/09/22 Right Brachial 39 cm 0 cm (Active)  Indication for Insertion or Continuance of Line Home intravenous therapies (PICC only) 08/09/22 1500  Exposed Catheter (cm) 0 cm 08/09/22 1500  Site Assessment Clean, Dry, Intact 08/09/22 1500  Line Status Flushed;Saline locked;Blood return noted 08/09/22 1500  Dressing Type Transparent;Securing device 08/09/22 1500  Dressing Status Antimicrobial disc in place;Clean, Dry, Intact 08/09/22 1500  Safety Lock Not Applicable 53/97/67 3419  Line Care Connections checked and tightened 08/09/22 1500  Dressing Intervention New dressing 08/09/22 1500  Dressing Change Due 08/16/22 08/09/22 1500       Holley Bouche Renee 08/09/2022, 3:34 PM

## 2022-08-10 DIAGNOSIS — L02519 Cutaneous abscess of unspecified hand: Secondary | ICD-10-CM | POA: Diagnosis not present

## 2022-08-10 LAB — CBC
HCT: 34.1 % — ABNORMAL LOW (ref 39.0–52.0)
Hemoglobin: 12.1 g/dL — ABNORMAL LOW (ref 13.0–17.0)
MCH: 34 pg (ref 26.0–34.0)
MCHC: 35.5 g/dL (ref 30.0–36.0)
MCV: 95.8 fL (ref 80.0–100.0)
Platelets: 240 10*3/uL (ref 150–400)
RBC: 3.56 MIL/uL — ABNORMAL LOW (ref 4.22–5.81)
RDW: 12.1 % (ref 11.5–15.5)
WBC: 8 10*3/uL (ref 4.0–10.5)
nRBC: 0 % (ref 0.0–0.2)

## 2022-08-10 LAB — CK: Total CK: 51 U/L (ref 49–397)

## 2022-08-10 MED ORDER — SODIUM CHLORIDE 0.9 % IV SOLN
8.0000 mg/kg | Freq: Every day | INTRAVENOUS | Status: DC
  Filled 2022-08-10: qty 12

## 2022-08-10 MED ORDER — DAPTOMYCIN IV (FOR PTA / DISCHARGE USE ONLY): 600.0000 mg | INTRAVENOUS | 0 refills | Status: DC

## 2022-08-10 MED ORDER — ASCORBIC ACID 1000 MG PO TABS: 1000.0000 mg | ORAL_TABLET | Freq: Every day | ORAL | 0 refills | Status: AC

## 2022-08-10 MED ORDER — SODIUM CHLORIDE 0.9 % IV SOLN
8.0000 mg/kg | Freq: Every day | INTRAVENOUS | Status: DC
  Administered 2022-08-10: 600 mg via INTRAVENOUS
  Filled 2022-08-10: qty 12

## 2022-08-10 MED ORDER — OXYCODONE HCL 5 MG PO TABS: 5.0000 mg | ORAL_TABLET | Freq: Four times a day (QID) | ORAL | 0 refills | Status: AC | PRN

## 2022-08-10 MED ORDER — INFLUENZA VAC A&B SA ADJ QUAD 0.5 ML IM PRSY
0.5000 mL | PREFILLED_SYRINGE | Freq: Once | INTRAMUSCULAR | Status: AC
  Administered 2022-08-10: 0.5 mL via INTRAMUSCULAR
  Filled 2022-08-10: qty 0.5

## 2022-08-10 NOTE — Progress Notes (Signed)
PHARMACY CONSULT NOTE FOR:  OUTPATIENT  PARENTERAL ANTIBIOTIC THERAPY (OPAT)  Indication: 2nd digit osteomyelitis/hand abscess Regimen:  Daptomycin 600 mg IV every 24 hours  End date: 09/03/22  IV antibiotic discharge orders are pended. To discharging provider:  please sign these orders via discharge navigator,  Select New Orders & click on the button choice - Manage This Unsigned Work.     Thank you for allowing pharmacy to be a part of this patient's care.  Jimmy Footman, PharmD, BCPS, [facility] Infectious Diseases Clinical Pharmacist Phone: [phone] 08/10/2022, 8:42 AM

## 2022-08-10 NOTE — Discharge Summary (Signed)
Physician Discharge Summary   Patient: Joshua Walter MRN: [id number] DOB: Aug 26, 1951  Admit date:     08/06/2022  Discharge date: 08/10/22  Discharge Physician: Elmarie Shiley   PCP: [name], MD   Recommendations at discharge:    Needs to follow up with Dr Fredna Dow post I and D of hand abscess.  Needs to follow up with ID in 4 weeks.   Discharge Diagnoses: Principal Problem:   Hand abscess Active Problems:   Peripheral vascular disease, unspecified ([place])   PVD (peripheral vascular disease) ([place])   Hyponatremia  Resolved Problems:   * No resolved hospital problems. *  Hospital Course: 71 year old with past medical history significant for hyperlipidemia, peripheral vascular disease, prior osteomyelitis involving his right lower extremity presented to ED due to concern of left hand infection/osteomyelitis.  He was referred by his orthopedic to the ED for further evaluation, Dr. Annie Main.  Hand surgeon has been consulted Dr. Maryan Rued is planning to take patient to the OR on 11/18.   Patient was also found to have hyponatremia, leukocytosis with white blood cell of 13.  X-ray of the left hand showed: Cortical irregularity/destruction along the medial/ulnar aspect of the 2nd metacarpal head, suggesting osteomyelitis. Soft tissue swelling overlying the dorsal aspect of the hand.  Assessment and Plan: 1-Left Hand abscess, with osteomyelitis of index finger metacarpal:  -Hand abscess, second metacarpal head concern for osteomyelitis -Outpatient MRI: per report with osteomyelitis. I dont have report  -Treated  with IV  vancomycin -Patient underwent incision and drainage of left hand including bony abscess by Dr. Fredna Dow on 11/18 -ID has been consulted. He will need 4 weeks IV antibiotics Daptomycin. Then 4 weeks of oral antibiotics.  -Wound culture; growing MRSA  -he has Picc line in placed. Plan to discharge home today.   2-Hyponatremia: Suspect hypovolemic. Treated  with IV  fluids 3-Peripheral Vascular disease 4-Hyperlipidemia: Continue with Zocor 5-Peripheral neuropathy: Continue with gabapentin          Consultants: Dr Fredna Dow, ID  Procedures performed: I and D of hand abscess.  Disposition: Home Diet recommendation:  Discharge Diet Orders (From admission, onward)     Start     Ordered   08/10/22 0000  Diet - low sodium heart healthy        08/10/22 1042           Cardiac diet DISCHARGE MEDICATION: Allergies as of 08/10/2022   No Known Allergies      Medication List     STOP taking these medications    doxycycline 100 MG tablet Commonly known as: VIBRA-TABS   losartan-hydrochlorothiazide 100-25 MG tablet Commonly known as: HYZAAR   traMADol 50 MG tablet Commonly known as: ULTRAM       TAKE these medications    ascorbic acid 1000 MG tablet Commonly known as: VITAMIN C Take 1 tablet (1,000 mg total) by mouth daily. Start taking on: August 11, 2022   b complex vitamins capsule Take 1 capsule by mouth daily.   baclofen 10 MG tablet Commonly known as: LIORESAL Take 10 mg by mouth daily.   daptomycin  IVPB Commonly known as: CUBICIN Inject 600 mg into the vein daily for 24 days. Indication:  Hand abscess/osteo First Dose: Yes Last Day of Therapy:  09/03/22 Labs - Once weekly:  CBC/D, BMP, and CPK Labs - Every other week:  ESR and CRP Method of administration: IV Push Method of administration may be changed at the discretion of home infusion pharmacist based upon  assessment of the patient and/or caregiver's ability to self-administer the medication ordered.   gabapentin 300 MG capsule Commonly known as: NEURONTIN Take 300-600 mg by mouth 3 (three) times daily. Take 2 capsules (600 mg) in the morning, Take 1 capsule (300 mg) at lunch and Take 2 capsules (600 mg) in the evening   multivitamin with minerals Tabs tablet Take 1 tablet by mouth daily.   oxyCODONE 5 MG immediate release tablet Commonly known as: Oxy  IR/ROXICODONE Take 1 tablet (5 mg total) by mouth every 6 (six) hours as needed for moderate pain or breakthrough pain.   simvastatin 40 MG tablet Commonly known as: ZOCOR Take 40 mg by mouth at bedtime.               Discharge Care Instructions  (From admission, onward)           Start     Ordered   08/10/22 0000  Change dressing on IV access line weekly and PRN  (Home infusion instructions - [facility] )        08/10/22 1041            Follow-up Information     Leanora Cover, MD Follow up.   Specialty: Orthopedic Surgery Why: 2 to 4 days after discharge Contact information: [place] [address] [phone]         Care, [facility] Follow up.   Specialty: [place] Why: someone from the office at [facility] will call to schedule visits Contact information: [place] [address] [phone]         [facility] Follow up.   Why: Someone will call you to schedule first home visit. Contact information: [address] [place], [place] [address]  [phone]               Discharge Exam: Danley Danker Weights   08/06/22 1802 08/07/22 1349  Weight: 74.8 kg 74.8 kg   General; NAD  Condition at discharge: stable  The results of significant diagnostics from this hospitalization (including imaging, microbiology, ancillary and laboratory) are listed below for reference.   Imaging Studies: [place] EKG SITE RITE  Result Date: 08/09/2022 If Site Rite image not attached, placement could not be confirmed due to current cardiac rhythm.  DG Hand Complete Left  Result Date: 08/07/2022 CLINICAL DATA:  Infection/osteomyelitis of the left hand EXAM: LEFT HAND - COMPLETE 3+ VIEW COMPARISON:  None Available. FINDINGS: No fracture or dislocation is seen. Expansile deformity of the 2nd metacarpal head. Mild degenerative changes of the 2nd and 3rd MCP joints.  Soft tissue swelling overlying the dorsal aspect of the hand on the lateral view. On the oblique radiograph, there is cortical irregularity/destruction along the medial/ulnar aspect of the 2nd metacarpal head, suggesting osteomyelitis. IMPRESSION: Cortical irregularity/destruction along the medial/ulnar aspect of the 2nd metacarpal head, suggesting osteomyelitis. Soft tissue swelling overlying the dorsal aspect of the hand. Electronically Signed   By: Julian Hy M.D.   On: 08/07/2022 01:12    Microbiology: Results for orders placed or performed during the hospital encounter of 08/06/22  Culture, blood (Routine x 2)     Status: None (Preliminary result)   Collection Time: 08/06/22  6:26 PM   Specimen: BLOOD  Result Value Ref Range Status   Specimen Description BLOOD RIGHT ANTECUBITAL  Final   Special Requests   Final    BOTTLES DRAWN AEROBIC AND ANAEROBIC Blood Culture adequate volume  Culture   Final    NO GROWTH 4 DAYS Performed at [facility], [place] [address] [place], [place] [address]    Report Status PENDING  Incomplete  Culture, blood (Routine x 2)     Status: None (Preliminary result)   Collection Time: 08/07/22  1:35 AM   Specimen: BLOOD LEFT FOREARM  Result Value Ref Range Status   Specimen Description BLOOD LEFT FOREARM  Final   Special Requests   Final    BOTTLES DRAWN AEROBIC AND ANAEROBIC Blood Culture adequate volume   Culture   Final    NO GROWTH 3 DAYS Performed at [facility], [place] [address], [place], [place] [address]    Report Status PENDING  Incomplete  MRSA Next Gen by PCR, Nasal     Status: None   Collection Time: 08/07/22  1:52 AM   Specimen: Nasal Mucosa; Nasal Swab  Result Value Ref Range Status   MRSA by PCR Next Gen NOT DETECTED NOT DETECTED Final    Comment: (NOTE) The GeneXpert MRSA Assay (FDA approved for NASAL specimens only), is one component of a comprehensive MRSA colonization surveillance program. It is not intended to  diagnose MRSA infection nor to guide or monitor treatment for MRSA infections. Test performance is not FDA approved in patients less than 66 years old. Performed at [facility], [place] [address], [place], [place] [address]   Aerobic/Anaerobic Culture w Gram Stain (surgical/deep wound)     Status: None (Preliminary result)   Collection Time: 08/07/22  3:03 PM   Specimen: PATH Other; Tissue  Result Value Ref Range Status   Specimen Description TISSUE  Final   Special Requests LEFT HAND, VANC  Final   Gram Stain   Final    RARE WBC PRESENT, PREDOMINANTLY PMN NO ORGANISMS SEEN Performed at [facility], [place] [address] [place], [place] [address]    Culture   Final    RARE METHICILLIN RESISTANT STAPHYLOCOCCUS AUREUS NO ANAEROBES ISOLATED; CULTURE IN PROGRESS FOR 5 DAYS    Report Status PENDING  Incomplete   Organism ID, Bacteria METHICILLIN RESISTANT STAPHYLOCOCCUS AUREUS  Final      Susceptibility   Methicillin resistant staphylococcus aureus - MIC*    CIPROFLOXACIN <=0.5 SENSITIVE Sensitive     ERYTHROMYCIN >=8 RESISTANT Resistant     GENTAMICIN <=0.5 SENSITIVE Sensitive     OXACILLIN >=4 RESISTANT Resistant     TETRACYCLINE <=1 SENSITIVE Sensitive     VANCOMYCIN 1 SENSITIVE Sensitive     TRIMETH/SULFA <=10 SENSITIVE Sensitive     CLINDAMYCIN <=0.25 SENSITIVE Sensitive     RIFAMPIN <=0.5 SENSITIVE Sensitive     Inducible Clindamycin NEGATIVE Sensitive     * RARE METHICILLIN RESISTANT STAPHYLOCOCCUS AUREUS  Aerobic/Anaerobic Culture w Gram Stain (surgical/deep wound)     Status: None (Preliminary result)   Collection Time: 08/07/22  3:16 PM   Specimen: PATH Bone resection; Tissue  Result Value Ref Range Status   Specimen Description BONE  Final   Special Requests LEFT INDEX METACARPAL, VANC  Final   Gram Stain   Final    NO ORGANISMS SEEN NO WBC SEEN Performed at [facility], [address] [place], [place] [address]    Culture   Final    RARE  STAPHYLOCOCCUS AUREUS SUSCEPTIBILITIES PERFORMED ON PREVIOUS CULTURE WITHIN THE LAST 5 DAYS. CRITICAL RESULT CALLED TO, READ BACK BY AND VERIFIED WITH: RN MARINA.S AT 6967 ON 08/09/2022 BY T.SAAD. NO ANAEROBES  ISOLATED; CULTURE IN PROGRESS FOR 5 DAYS    Report Status PENDING  Incomplete    Labs: CBC: Recent Labs  Lab 08/06/22 1826 08/07/22 0716 08/08/22 0252 08/09/22 0730 08/10/22 0237  WBC 13.7* 9.5 10.5 7.9 8.0  NEUTROABS 9.5* 6.6  --   --   --   HGB 13.4 13.6 13.1 14.1 12.1*  HCT 38.8* 38.9* 37.3* 41.9 34.1*  MCV 96.3 95.6 96.4 98.4 95.8  PLT 286 283 283 261 336   Basic Metabolic Panel: Recent Labs  Lab 08/06/22 1826 08/07/22 0716 08/08/22 1111  NA 128* 134* 133*  K 3.7 4.2 3.6  CL 90* 100 100  CO2 _0 GLUCOSE 75 108* 159*  BUN 8 7* 5*  CREATININE 0.68 0.58* 0.67  CALCIUM 9.0 8.9 8.4*  MG  --  1.8  --   PHOS  --  2.6  --    Liver Function Tests: Recent Labs  Lab 08/06/22 1826  AST 24  ALT 22  ALKPHOS 49  BILITOT 1.0  PROT 6.9  ALBUMIN 3.5   CBG: No results for input(s): "GLUCAP" in the last 168 hours.  Discharge time spent: greater than 30 minutes.  Signed: Elmarie Shiley, MD [facility] 08/10/2022

## 2022-08-10 NOTE — TOC Transition Note (Signed)
Transition of Care [facility]) - CM/SW Discharge Note   Patient Details  Name: LUIZ TRUMPOWER MRN: [id number] Date of Birth: [birth year]  Transition of Care [facility]) CM/SW Contact:  Tom-Johnson, Renea Ee, RN Phone Number: 08/10/2022, 11:22 AM   Clinical Narrative:     Patient is scheduled for discharge today. Patient will be discharging home on IV abx through [facility] and HHRN with [facility]. Alvis Lemmings will start care tomorrow. Info on AVS. Denies any other needs. Family to transport at discharge. No further TOC needs noted.     Final next level of care: [place] Barriers to Discharge: Barriers Resolved   Patient Goals and CMS Choice Patient states their goals for this hospitalization and ongoing recovery are:: To return home CMS Medicare.gov Compare Post Acute Care list provided to:: Patient Choice offered to / list presented to : Patient  Discharge Placement                Patient to be transferred to facility by: Family      Discharge Plan and Services   Discharge Planning Services: NA Post Acute Care Choice: Durable Medical Equipment, Home Health          DME Arranged: N/A DME Agency: NA       HH Arranged: RN, IV Antibiotics HH Agency: [facility], [facility] Date [place]: 08/09/22 Time [place]: 1212 Representative spoke with at [place]: Tommi Rumps and Pam  Social Determinants of Health ([place]) Interventions     Readmission Risk Interventions     No data to display

## 2022-08-10 NOTE — Progress Notes (Signed)
Mobility Specialist Progress Note   08/10/22 1129  Mobility  Activity Ambulated independently in hallway  Level of Assistance Modified independent, requires aide device or extra time  Assistive Device Other (Comment) (IV pole)  Distance Ambulated (ft) 550 ft  Activity Response Tolerated well  $Mobility charge 1 Mobility   Received pt in bed having no complaints and agreeable to mobility. Pt was asymptomatic throughout ambulation and returned to room w/o fault. Left back in bed w/ call bell in reach and all needs met.  Holland Falling Mobility Specialist Acute Rehab Office:  [phone]

## 2022-08-10 NOTE — Care Management Important Message (Signed)
Important Message  Patient Details  Name: Joshua Walter MRN: [id number] Date of Birth: [birth year]   Medicare Important Message Given:  Yes     Hannah Beat 08/10/2022, 11:41 AM

## 2022-08-10 NOTE — Progress Notes (Signed)
Joshua Walter for Infectious Disease    Date of Admission:  08/06/2022   Total days of antibiotics 4          Joshua Walter is a 71 y.o. male with  MRSA hand abscess and 2nd digit osteo Principal Problem:   Hand abscess Active Problems:   Peripheral vascular disease, unspecified ([place])   PVD (peripheral vascular disease) ([place])   Hyponatremia    Subjective: Remains afebrile. Tolerated picc line placement. Noticing increase rom in left hand  Medications:   vitamin C  1,000 mg Oral Daily   B-complex with vitamin C  1 tablet Oral Daily   baclofen  10 mg Oral Daily   Chlorhexidine Gluconate Cloth  6 each Topical Daily   gabapentin  300 mg Oral Q1400   gabapentin  600 mg Oral BID   heparin  5,000 Units Subcutaneous Q8H   influenza vaccine adjuvanted  0.5 mL Intramuscular Once   multivitamin with minerals  1 tablet Oral Daily   simvastatin  40 mg Oral QHS   sodium chloride flush  10-40 mL Intracatheter Q12H    Objective: Vital signs in last 24 hours: Temp:  [98.4 F (36.9 C)-98.9 F (37.2 C)] 98.4 F (36.9 C) (11/20 2203) Pulse Rate:  [73-77] 77 (11/20 2203) Resp:  [16] 16 (11/20 1641) BP: (129-132)/(69-83) 132/83 (11/20 2203) SpO2:  [97 %-99 %] 97 % (11/20 2203) Physical Exam  Constitutional: He is oriented to person, place, and time. He appears well-developed and well-nourished. No distress.  HENT:  Mouth/Throat: Oropharynx is clear and moist. No oropharyngeal exudate.  Cardiovascular: Normal rate, regular rhythm and normal heart sounds. Exam reveals no gallop and no friction rub.  No murmur heard.  Pulmonary/Chest: Effort normal and breath sounds normal. No respiratory distress. He has no wheezes.  Abdominal: Soft. Bowel sounds are normal. He exhibits no distension. There is no tenderness.  KYH:CWCB hand wrapped/casted. Right arm picc line is c/d/i Neurological: He is alert and oriented to person, place, and time.  Skin: Skin is warm and dry. No rash noted.  No erythema.  Psychiatric: He has a normal mood and affect. His behavior is normal.    Lab Results Recent Labs    08/08/22 1111 08/09/22 0730 08/10/22 0237  WBC  --  7.9 8.0  HGB  --  14.1 12.1*  HCT  --  41.9 34.1*  NA 133*  --   --   K 3.6  --   --   CL 100  --   --   CO2 25  --   --   BUN 5*  --   --   CREATININE 0.67  --   --     Lab Results  Component Value Date   ESRSEDRATE 26 (H) 08/07/2022    Microbiology: reviewed Studies/Results: [place] EKG SITE RITE  Result Date: 08/09/2022 If Site Rite image not attached, placement could not be confirmed due to current cardiac rhythm.    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 Will get baseline CK and opat orders are in place. Coordinated with home health. Will arrange for 4 wk of iv daptomycin and plan to transition to oral abtx on follow up appt in 4 wk. Continue on hydrotherapy per dr Fredna Dow  Health maintenance= will receive flu vaccine  Leukocytosis = improved, resolved since wash out and iv abtx  [facility] Pager: [phone]  08/10/2022, 9:38 AM

## 2022-08-11 DIAGNOSIS — L02519 Cutaneous abscess of unspecified hand: Secondary | ICD-10-CM | POA: Diagnosis not present

## 2022-08-11 LAB — CULTURE, BLOOD (ROUTINE X 2)
Culture: NO GROWTH
Special Requests: ADEQUATE

## 2022-08-12 LAB — AEROBIC/ANAEROBIC CULTURE W GRAM STAIN (SURGICAL/DEEP WOUND): Gram Stain: NONE SEEN

## 2022-08-12 LAB — CULTURE, BLOOD (ROUTINE X 2)
Culture: NO GROWTH
Special Requests: ADEQUATE

## 2022-08-17 DIAGNOSIS — M86142 Other acute osteomyelitis, left hand: Secondary | ICD-10-CM | POA: Diagnosis not present

## 2022-08-18 DIAGNOSIS — M86142 Other acute osteomyelitis, left hand: Secondary | ICD-10-CM | POA: Diagnosis not present

## 2022-08-20 DIAGNOSIS — M25642 Stiffness of left hand, not elsewhere classified: Secondary | ICD-10-CM | POA: Diagnosis not present

## 2022-08-20 DIAGNOSIS — L02519 Cutaneous abscess of unspecified hand: Secondary | ICD-10-CM | POA: Diagnosis not present

## 2022-08-20 DIAGNOSIS — M86142 Other acute osteomyelitis, left hand: Secondary | ICD-10-CM | POA: Diagnosis not present

## 2022-08-20 DIAGNOSIS — M79642 Pain in left hand: Secondary | ICD-10-CM | POA: Diagnosis not present

## 2022-08-20 DIAGNOSIS — S61402D Unspecified open wound of left hand, subsequent encounter: Secondary | ICD-10-CM | POA: Diagnosis not present

## 2022-08-24 DIAGNOSIS — M79642 Pain in left hand: Secondary | ICD-10-CM | POA: Diagnosis not present

## 2022-08-24 DIAGNOSIS — L02512 Cutaneous abscess of left hand: Secondary | ICD-10-CM | POA: Diagnosis not present

## 2022-08-24 DIAGNOSIS — M86142 Other acute osteomyelitis, left hand: Secondary | ICD-10-CM | POA: Diagnosis not present

## 2022-08-24 DIAGNOSIS — B9562 Methicillin resistant Staphylococcus aureus infection as the cause of diseases classified elsewhere: Secondary | ICD-10-CM | POA: Diagnosis not present

## 2022-08-24 DIAGNOSIS — M25642 Stiffness of left hand, not elsewhere classified: Secondary | ICD-10-CM | POA: Diagnosis not present

## 2022-08-27 DIAGNOSIS — S61402D Unspecified open wound of left hand, subsequent encounter: Secondary | ICD-10-CM | POA: Diagnosis not present

## 2022-08-27 DIAGNOSIS — M86142 Other acute osteomyelitis, left hand: Secondary | ICD-10-CM | POA: Diagnosis not present

## 2022-08-27 DIAGNOSIS — M25642 Stiffness of left hand, not elsewhere classified: Secondary | ICD-10-CM | POA: Diagnosis not present

## 2022-08-27 DIAGNOSIS — M79642 Pain in left hand: Secondary | ICD-10-CM | POA: Diagnosis not present

## 2022-08-28 DIAGNOSIS — L02519 Cutaneous abscess of unspecified hand: Secondary | ICD-10-CM | POA: Diagnosis not present

## 2022-08-30 ENCOUNTER — Other Ambulatory Visit: Payer: Self-pay

## 2022-08-30 ENCOUNTER — Encounter (HOSPITAL_BASED_OUTPATIENT_CLINIC_OR_DEPARTMENT_OTHER): Payer: Self-pay | Admitting: Emergency Medicine

## 2022-08-30 ENCOUNTER — Emergency Department (HOSPITAL_BASED_OUTPATIENT_CLINIC_OR_DEPARTMENT_OTHER)
Admission: EM | Admit: 2022-08-30 | Discharge: 2022-08-30 | Disposition: A | Discharge status: Home / Self Care | Payer: Medicare HMO | Attending: Emergency Medicine | Admitting: Emergency Medicine

## 2022-08-30 ENCOUNTER — Telehealth: Payer: Self-pay | Admitting: Pharmacist

## 2022-08-30 DIAGNOSIS — M86142 Other acute osteomyelitis, left hand: Secondary | ICD-10-CM | POA: Diagnosis not present

## 2022-08-30 DIAGNOSIS — T829XXA Unspecified complication of cardiac and vascular prosthetic device, implant and graft, initial encounter: Secondary | ICD-10-CM

## 2022-08-30 DIAGNOSIS — L02512 Cutaneous abscess of left hand: Secondary | ICD-10-CM | POA: Diagnosis not present

## 2022-08-30 DIAGNOSIS — Y712 Prosthetic and other implants, materials and accessory cardiovascular devices associated with adverse incidents: Secondary | ICD-10-CM | POA: Insufficient documentation

## 2022-08-30 DIAGNOSIS — T82898A Other specified complication of vascular prosthetic devices, implants and grafts, initial encounter: Secondary | ICD-10-CM | POA: Insufficient documentation

## 2022-08-30 DIAGNOSIS — L02519 Cutaneous abscess of unspecified hand: Secondary | ICD-10-CM

## 2022-08-30 DIAGNOSIS — B9562 Methicillin resistant Staphylococcus aureus infection as the cause of diseases classified elsewhere: Secondary | ICD-10-CM | POA: Diagnosis not present

## 2022-08-30 MED ORDER — DOXYCYCLINE HYCLATE 100 MG PO TABS: 100.0000 mg | ORAL_TABLET | Freq: Two times a day (BID) | ORAL | 0 refills | Status: AC

## 2022-08-30 NOTE — Discharge Instructions (Signed)
You were seen today to have a central line pulled because of developing cellulitis around the site. You have been prescribed doxycycline by your primary doctor, please pick up this prescription and get it started as soon as possible. You must wait 15 minutes after the central line pull before you are allowed to drive. Please follow-up with your primary care provider within 48 hours for reassessment to make sure the site is clearing up.  If you begin to have worsening fevers or chills, nausea or vomiting, pain at the site, please return for further care and management.  Tretha Sciara MD

## 2022-08-30 NOTE — Telephone Encounter (Addendum)
Home health nurse, Inez Catalina at [phone], called today regarding patient's PICC line. She states that the site is red, the dressing had drainage on it when removed, and his arm is swollen (about 1.5 cm greater than last week). No fever, temp today was 98. He is currently on daptomycin for osteomyelitis of his hand until Friday, 12/15. Spoke with Dr. Baxter Flattery. She advised patient to go to the ED for PICC removal and to assess for possible infection/bacteremia. Will go ahead and transition him to oral therapy - doxycycline 100 mg PO BID x 3 weeks. Called [place] back and relayed information. She volunteered to call patient and coordinate. I asked her to tell him that I sent the doxycycline in to the [facility] in [place]. She will call and have the patient call if they have any issues or questions.   Glenard Keesling L. Maico Mulvehill, PharmD, BCIDP, AAHIVP, CPP Clinical Pharmacist Practitioner Infectious Diseases [facility] 08/30/2022, 2:21 PM

## 2022-08-30 NOTE — ED Triage Notes (Signed)
Patient arrives ambulatory by POV with PICC line to right upper arm. Reports redness and oozing at site. Has been in for approx 2.5 weeks. Patient states he was sent here to have it removed.

## 2022-08-30 NOTE — ED Provider Notes (Signed)
[facility] EMERGENCY DEPT Provider Note   CSN: [id number] Arrival date & time: 08/30/22  1515     History Chief Complaint  Patient presents with   Vascular Access Problem    HPI Joshua Walter is a 71 y.o. male presenting for central line removal.  He denies fevers or chills, nausea vomiting, syncope shortness of breath.  He had a central line in for the past few weeks for osteomyelitis of the left hand.  He was told that the central line site was looking inflamed and that he could be transition to p.o. antibiotics at this time.  He was told to have the central line pulled.  Patient's recorded medical, surgical, social, medication list and allergies were reviewed in the Snapshot window as part of the initial history.   Review of Systems   Review of Systems  Constitutional:  Negative for chills and fever.  HENT:  Negative for ear pain and sore throat.   Eyes:  Negative for pain and visual disturbance.  Respiratory:  Negative for cough and shortness of breath.   Cardiovascular:  Negative for chest pain and palpitations.  Gastrointestinal:  Negative for abdominal pain and vomiting.  Genitourinary:  Negative for dysuria and hematuria.  Musculoskeletal:  Negative for arthralgias and back pain.  Skin:  Positive for rash. Negative for color change.  Neurological:  Negative for seizures and syncope.  All other systems reviewed and are negative.   Physical Exam Updated Vital Signs BP (!) 155/97 (BP Location: Left Leg)   Pulse 98   Temp 98.2 F (36.8 C) (Oral)   Resp 17   Ht 5' 10.5" (1.791 m)   Wt 74.8 kg   SpO2 98%   BMI 23.34 kg/m  Physical Exam Vitals and nursing note reviewed.  Constitutional:      General: He is not in acute distress.    Appearance: He is well-developed.  HENT:     Head: Normocephalic and atraumatic.  Eyes:     Conjunctiva/sclera: Conjunctivae normal.  Cardiovascular:     Rate and Rhythm: Normal rate and regular rhythm.     Heart  sounds: No murmur heard. Pulmonary:     Effort: Pulmonary effort is normal. No respiratory distress.     Breath sounds: Normal breath sounds.  Abdominal:     Palpations: Abdomen is soft.     Tenderness: There is no abdominal tenderness.  Musculoskeletal:        General: No swelling.     Cervical back: Neck supple.  Skin:    General: Skin is warm and dry.     Capillary Refill: Capillary refill takes less than 2 seconds.     Findings: Lesion and rash (Obvious erythema around the right-sided central line) present.  Neurological:     Mental Status: He is alert.  Psychiatric:        Mood and Affect: Mood normal.      ED Course/ Medical Decision Making/ A&P    Procedures Procedures   Medications Ordered in ED Medications - No data to display  Medical Decision Making:    Joshua Walter is a 71 y.o. male who presented to the ED today with concern for cellulitis developing around the central line detailed above.     Patient's presentation is complicated by their history of multiple comorbid medical conditions.  Patient placed on continuous vitals and telemetry monitoring while in ED which was reviewed periodically.   Complete initial physical exam performed, notably the patient  was  hemodynamically stable in no acute distress.      Reviewed and confirmed nursing documentation for past medical history, family history, social history.    Initial Assessment:   Patient central line needs to be removed with concern for developing infection.  Typically this is performed by local vascular access team to place a central line, however patient was unable to make it to the clinic before they closed.  I do have concern that leaving the central in place with cellulitis around may predispose patient to developing sepsis.  I had a shared medical decision making with the patient that typically these are managed by vascular access team's and that removal may complicate his care.  However given the  risk of leaving the line in, I do believe it is reasonable to remove the line at this time and patient agrees. Central line removed according to standard precautions.  Patient made to exert extrathoracic pressure consistently and line was removed slowly with pressure being applied to the site for 5 minutes following it.  Patient was kept in the emergency department for 15 minutes to ensure no development of strokelike symptoms before being allowed to leave.  Given no new symptoms, patient discharged with no further acute events. Concerning his cellulitis, he has been prescribed doxycycline and will follow-up with his PCP closely. Strict return precautions regarding development of sepsis-like symptoms reinforced the patient expressed understanding.  Disposition:  I have considered need for hospitalization, however, considering all of the above, I believe this patient is stable for discharge at this time.  Patient/family educated about specific return precautions for given chief complaint and symptoms.  Patient/family educated about follow-up with PCP.     Patient/family expressed understanding of return precautions and need for follow-up. Patient spoken to regarding all imaging and laboratory results and appropriate follow up for these results. All education provided in verbal form with additional information in written form. Time was allowed for answering of patient questions. Patient discharged.    Emergency Department Medication Summary:   Medications - No data to display    Clinical Impression:  1. Central line complication, initial encounter      Discharge   Final Clinical Impression(s) / ED Diagnoses Final diagnoses:  Central line complication, initial encounter    Rx / DC Orders ED Discharge Orders     None         Tretha Sciara, MD 08/30/22 2300

## 2022-08-30 NOTE — ED Notes (Signed)
PICC line removed by [place] MD.

## 2022-08-31 DIAGNOSIS — M79642 Pain in left hand: Secondary | ICD-10-CM | POA: Diagnosis not present

## 2022-08-31 DIAGNOSIS — M25642 Stiffness of left hand, not elsewhere classified: Secondary | ICD-10-CM | POA: Diagnosis not present

## 2022-08-31 DIAGNOSIS — S61402D Unspecified open wound of left hand, subsequent encounter: Secondary | ICD-10-CM | POA: Diagnosis not present

## 2022-08-31 DIAGNOSIS — M86142 Other acute osteomyelitis, left hand: Secondary | ICD-10-CM | POA: Diagnosis not present

## 2022-09-03 DIAGNOSIS — M25642 Stiffness of left hand, not elsewhere classified: Secondary | ICD-10-CM | POA: Diagnosis not present

## 2022-09-03 DIAGNOSIS — S61402D Unspecified open wound of left hand, subsequent encounter: Secondary | ICD-10-CM | POA: Diagnosis not present

## 2022-09-03 DIAGNOSIS — M86142 Other acute osteomyelitis, left hand: Secondary | ICD-10-CM | POA: Diagnosis not present

## 2022-09-03 DIAGNOSIS — M79642 Pain in left hand: Secondary | ICD-10-CM | POA: Diagnosis not present

## 2022-09-07 DIAGNOSIS — M86142 Other acute osteomyelitis, left hand: Secondary | ICD-10-CM | POA: Diagnosis not present

## 2022-09-07 DIAGNOSIS — M25642 Stiffness of left hand, not elsewhere classified: Secondary | ICD-10-CM | POA: Diagnosis not present

## 2022-09-07 DIAGNOSIS — M79642 Pain in left hand: Secondary | ICD-10-CM | POA: Diagnosis not present

## 2022-09-07 DIAGNOSIS — S61402D Unspecified open wound of left hand, subsequent encounter: Secondary | ICD-10-CM | POA: Diagnosis not present

## 2022-09-08 DIAGNOSIS — I10 Essential (primary) hypertension: Secondary | ICD-10-CM | POA: Diagnosis not present

## 2022-09-08 DIAGNOSIS — M961 Postlaminectomy syndrome, not elsewhere classified: Secondary | ICD-10-CM | POA: Diagnosis not present

## 2022-09-08 DIAGNOSIS — K279 Peptic ulcer, site unspecified, unspecified as acute or chronic, without hemorrhage or perforation: Secondary | ICD-10-CM | POA: Diagnosis not present

## 2022-09-08 DIAGNOSIS — I779 Disorder of arteries and arterioles, unspecified: Secondary | ICD-10-CM | POA: Diagnosis not present

## 2022-09-08 DIAGNOSIS — E7800 Pure hypercholesterolemia, unspecified: Secondary | ICD-10-CM | POA: Diagnosis not present

## 2022-09-08 DIAGNOSIS — Z23 Encounter for immunization: Secondary | ICD-10-CM | POA: Diagnosis not present

## 2022-09-08 DIAGNOSIS — Z0000 Encounter for general adult medical examination without abnormal findings: Secondary | ICD-10-CM | POA: Diagnosis not present

## 2022-09-16 ENCOUNTER — Inpatient Hospital Stay: Payer: Medicare HMO | Admitting: Internal Medicine

## 2022-09-27 ENCOUNTER — Other Ambulatory Visit: Payer: Self-pay

## 2022-09-27 ENCOUNTER — Ambulatory Visit: Payer: Medicare HMO | Admitting: Internal Medicine

## 2022-09-27 VITALS — BP 150/80 | HR 90 | Temp 98.3°F

## 2022-09-27 DIAGNOSIS — L02519 Cutaneous abscess of unspecified hand: Secondary | ICD-10-CM | POA: Diagnosis not present

## 2022-09-27 NOTE — Progress Notes (Signed)
RFV: follow up for mrsa hand abscess/osteo  Patient ID: Joshua Walter, male   DOB: 1951/03/05, 72 y.o.   MRN: [id number]  HPI Joshua Walter is a 72yo M with prior hx of mrsa infection of right knee, most recently developed left hand 2nd digit hand abscess/ early osteo with + cx, no bacteremia. Treated initially with daptomycin for 4 wks then converted to oral abtx. He developed irritation to picc line dressing and iv abtx discontinued on 12/11, and converted to 3 wk of doxycycline for which he completed. His left hand has cleared. His contact dermatitis is improved at site of picc line to right arm    Methicillin resistant staphylococcus aureus      MIC    CIPROFLOXACIN <=0.5 SENSI... Sensitive    CLINDAMYCIN <=0.25 SENS... Sensitive    ERYTHROMYCIN >=8 RESISTANT Resistant    GENTAMICIN <=0.5 SENSI... Sensitive    Inducible Clindamycin NEGATIVE Sensitive    OXACILLIN >=4 RESISTANT Resistant    RIFAMPIN <=0.5 SENSI... Sensitive    TETRACYCLINE <=1 SENSITIVE Sensitive    TRIMETH/SULFA <=10 SENSIT... Sensitive    VANCOMYCIN 1 SENSITIVE Sensitive    Outpatient Encounter Medications as of 09/27/2022  Medication Sig   ascorbic acid (VITAMIN C) 1000 MG tablet Take 1 tablet (1,000 mg total) by mouth daily.   b complex vitamins capsule Take 1 capsule by mouth daily.   baclofen (LIORESAL) 10 MG tablet Take 10 mg by mouth daily.   gabapentin (NEURONTIN) 300 MG capsule Take 300-600 mg by mouth 3 (three) times daily. Take 2 capsules (600 mg) in the morning, Take 1 capsule (300 mg) at lunch and Take 2 capsules (600 mg) in the evening   Multiple Vitamin (MULTIVITAMIN WITH MINERALS) TABS Take 1 tablet by mouth daily.   simvastatin (ZOCOR) 40 MG tablet Take 40 mg by mouth at bedtime.   doxycycline (VIBRA-TABS) 100 MG tablet Take 1 tablet (100 mg total) by mouth 2 (two) times daily. Take with food. (Patient not taking: Reported on 09/27/2022)   oxyCODONE (OXY IR/ROXICODONE) 5 MG immediate release tablet Take  1 tablet (5 mg total) by mouth every 6 (six) hours as needed for moderate pain or breakthrough pain. (Patient not taking: Reported on 09/27/2022)   No facility-administered encounter medications on file as of 09/27/2022.     Patient Active Problem List   Diagnosis Date Noted   Hand abscess 08/07/2022   Hyponatremia 08/07/2022   PVD (peripheral vascular disease) ([place]) 01/02/2013   Occlusion and stenosis of carotid artery without mention of cerebral infarction 11/20/2012   Atherosclerosis of native arteries of the extremities with ulceration(440.23) 10/30/2012   Peripheral vascular disease, unspecified ([place]) 10/30/2012   Ulcer of heel and midfoot ([place]) 10/30/2012     Health Maintenance Due  Topic Date Due   COVID-19 Vaccine (1) Never done   Hepatitis C Screening  Never done   DTaP/Tdap/Td (1 - Tdap) Never done   COLONOSCOPY (Pts 45-71yr Insurance coverage will need to be confirmed)  Never done   Zoster Vaccines- Shingrix (1 of 2) Never done   Pneumonia Vaccine 71 Years old (1 - PCV) Never done   Medicare Annual Wellness (AWV)  08/04/2022     Review of Systems 12 point ros is negative except what is mentioned in hpi Physical Exam   BP (!) 150/80   Pulse 90   Temp 98.3 F (36.8 C) (Oral)   SpO2 98%    Physical Exam  Constitutional: He is oriented to person, place, and time.  He appears well-developed and well-nourished. No distress.  HENT:  Mouth/Throat: Oropharynx is clear and moist. No oropharyngeal exudate.  Cardiovascular: Normal rate, regular rhythm and normal heart sounds. Exam reveals no gallop and no friction rub.  No murmur heard.  Pulmonary/Chest: Effort normal and breath sounds normal. No respiratory distress. He has no wheezes.  Abdominal: Soft. Bowel sounds are normal. He exhibits no distension. There is no tenderness.  Lymphadenopathy:  He has no cervical adenopathy.  Ext= healing dermatitis to arm at picc line dressing Neurological: He is alert and oriented to  person, place, and time.  Skin: Skin is warm and dry. No rash noted. No erythema.  Psychiatric: He has a normal mood and affect. His behavior is normal.    CBC Lab Results  Component Value Date   WBC 8.0 08/10/2022   RBC 3.56 (L) 08/10/2022   HGB 12.1 (L) 08/10/2022   HCT 34.1 (L) 08/10/2022   PLT 240 08/10/2022   MCV 95.8 08/10/2022   MCH 34.0 08/10/2022   MCHC 35.5 08/10/2022   RDW 12.1 08/10/2022   LYMPHSABS 1.7 08/07/2022   MONOABS 1.2 (H) 08/07/2022   EOSABS 0.1 08/07/2022    BMET Lab Results  Component Value Date   NA 133 (L) 08/08/2022   K 3.6 08/08/2022   CL 100 08/08/2022   CO2 25 08/08/2022   GLUCOSE 159 (H) 08/08/2022   BUN 5 (L) 08/08/2022   CREATININE 0.67 08/08/2022   CALCIUM 8.4 (L) 08/08/2022   GFRNONAA >60 08/08/2022   GFRAA >90 12/09/2012   Lab Results  Component Value Date   ESRSEDRATE 26 (H) 08/07/2022    Lab Results  Component Value Date   ESRSEDRATE 9 09/27/2022   Lab Results  Component Value Date   CRP 1.5 09/27/2022     Assessment and Plan Osteomyelitis of hand Will check sed rate and crp If elevated then continue on doxycycline '100mg'$  po bid x 4 wk Otherwise will consider as completed therapy

## 2022-09-28 LAB — SEDIMENTATION RATE: Sed Rate: 9 mm/h (ref 0–20)

## 2022-09-28 LAB — C-REACTIVE PROTEIN: CRP: 1.5 mg/L (ref <8.0)

## 2023-03-10 DIAGNOSIS — E7800 Pure hypercholesterolemia, unspecified: Secondary | ICD-10-CM | POA: Diagnosis not present

## 2023-03-10 DIAGNOSIS — I10 Essential (primary) hypertension: Secondary | ICD-10-CM | POA: Diagnosis not present

## 2023-03-10 DIAGNOSIS — M961 Postlaminectomy syndrome, not elsewhere classified: Secondary | ICD-10-CM | POA: Diagnosis not present

## 2023-03-10 DIAGNOSIS — I739 Peripheral vascular disease, unspecified: Secondary | ICD-10-CM | POA: Diagnosis not present

## 2023-09-12 DIAGNOSIS — Z0000 Encounter for general adult medical examination without abnormal findings: Secondary | ICD-10-CM | POA: Diagnosis not present

## 2023-09-12 DIAGNOSIS — R262 Difficulty in walking, not elsewhere classified: Secondary | ICD-10-CM | POA: Diagnosis not present

## 2023-09-12 DIAGNOSIS — I739 Peripheral vascular disease, unspecified: Secondary | ICD-10-CM | POA: Diagnosis not present

## 2023-09-12 DIAGNOSIS — E7800 Pure hypercholesterolemia, unspecified: Secondary | ICD-10-CM | POA: Diagnosis not present

## 2023-09-12 DIAGNOSIS — M961 Postlaminectomy syndrome, not elsewhere classified: Secondary | ICD-10-CM | POA: Diagnosis not present

## 2023-09-12 DIAGNOSIS — Z23 Encounter for immunization: Secondary | ICD-10-CM | POA: Diagnosis not present

## 2023-09-12 DIAGNOSIS — I10 Essential (primary) hypertension: Secondary | ICD-10-CM | POA: Diagnosis not present

## 2024-01-24 DIAGNOSIS — Z6824 Body mass index (BMI) 24.0-24.9, adult: Secondary | ICD-10-CM | POA: Diagnosis not present

## 2024-01-24 DIAGNOSIS — L02512 Cutaneous abscess of left hand: Secondary | ICD-10-CM | POA: Diagnosis not present

## 2024-02-10 DIAGNOSIS — I739 Peripheral vascular disease, unspecified: Secondary | ICD-10-CM | POA: Diagnosis not present

## 2024-02-10 DIAGNOSIS — I10 Essential (primary) hypertension: Secondary | ICD-10-CM | POA: Diagnosis not present

## 2024-02-18 DIAGNOSIS — I739 Peripheral vascular disease, unspecified: Secondary | ICD-10-CM | POA: Diagnosis not present

## 2024-02-18 DIAGNOSIS — I10 Essential (primary) hypertension: Secondary | ICD-10-CM | POA: Diagnosis not present

## 2024-02-18 DIAGNOSIS — E7800 Pure hypercholesterolemia, unspecified: Secondary | ICD-10-CM | POA: Diagnosis not present

## 2024-03-06 DIAGNOSIS — E7800 Pure hypercholesterolemia, unspecified: Secondary | ICD-10-CM | POA: Diagnosis not present

## 2024-03-06 DIAGNOSIS — R262 Difficulty in walking, not elsewhere classified: Secondary | ICD-10-CM | POA: Diagnosis not present

## 2024-03-06 DIAGNOSIS — M961 Postlaminectomy syndrome, not elsewhere classified: Secondary | ICD-10-CM | POA: Diagnosis not present

## 2024-03-06 DIAGNOSIS — Z6823 Body mass index (BMI) 23.0-23.9, adult: Secondary | ICD-10-CM | POA: Diagnosis not present

## 2024-03-06 DIAGNOSIS — I10 Essential (primary) hypertension: Secondary | ICD-10-CM | POA: Diagnosis not present

## 2024-03-10 DIAGNOSIS — I10 Essential (primary) hypertension: Secondary | ICD-10-CM | POA: Diagnosis not present

## 2024-03-10 DIAGNOSIS — I739 Peripheral vascular disease, unspecified: Secondary | ICD-10-CM | POA: Diagnosis not present

## 2024-03-19 DIAGNOSIS — I739 Peripheral vascular disease, unspecified: Secondary | ICD-10-CM | POA: Diagnosis not present

## 2024-03-19 DIAGNOSIS — E7800 Pure hypercholesterolemia, unspecified: Secondary | ICD-10-CM | POA: Diagnosis not present

## 2024-03-19 DIAGNOSIS — I10 Essential (primary) hypertension: Secondary | ICD-10-CM | POA: Diagnosis not present

## 2024-04-09 DIAGNOSIS — I739 Peripheral vascular disease, unspecified: Secondary | ICD-10-CM | POA: Diagnosis not present

## 2024-04-09 DIAGNOSIS — I10 Essential (primary) hypertension: Secondary | ICD-10-CM | POA: Diagnosis not present

## 2024-04-19 DIAGNOSIS — I739 Peripheral vascular disease, unspecified: Secondary | ICD-10-CM | POA: Diagnosis not present

## 2024-04-19 DIAGNOSIS — E7800 Pure hypercholesterolemia, unspecified: Secondary | ICD-10-CM | POA: Diagnosis not present

## 2024-04-19 DIAGNOSIS — I10 Essential (primary) hypertension: Secondary | ICD-10-CM | POA: Diagnosis not present

## 2024-05-09 DIAGNOSIS — I10 Essential (primary) hypertension: Secondary | ICD-10-CM | POA: Diagnosis not present

## 2024-05-09 DIAGNOSIS — I739 Peripheral vascular disease, unspecified: Secondary | ICD-10-CM | POA: Diagnosis not present

## 2024-05-20 DIAGNOSIS — I10 Essential (primary) hypertension: Secondary | ICD-10-CM | POA: Diagnosis not present

## 2024-05-20 DIAGNOSIS — E7800 Pure hypercholesterolemia, unspecified: Secondary | ICD-10-CM | POA: Diagnosis not present

## 2024-05-20 DIAGNOSIS — I739 Peripheral vascular disease, unspecified: Secondary | ICD-10-CM | POA: Diagnosis not present

## 2024-05-24 DIAGNOSIS — Z23 Encounter for immunization: Secondary | ICD-10-CM | POA: Diagnosis not present

## 2024-06-08 DIAGNOSIS — I739 Peripheral vascular disease, unspecified: Secondary | ICD-10-CM | POA: Diagnosis not present

## 2024-06-08 DIAGNOSIS — I10 Essential (primary) hypertension: Secondary | ICD-10-CM | POA: Diagnosis not present

## 2024-06-19 DIAGNOSIS — E7800 Pure hypercholesterolemia, unspecified: Secondary | ICD-10-CM | POA: Diagnosis not present

## 2024-06-19 DIAGNOSIS — I10 Essential (primary) hypertension: Secondary | ICD-10-CM | POA: Diagnosis not present

## 2024-06-19 DIAGNOSIS — I739 Peripheral vascular disease, unspecified: Secondary | ICD-10-CM | POA: Diagnosis not present

## 2024-07-08 DIAGNOSIS — I10 Essential (primary) hypertension: Secondary | ICD-10-CM | POA: Diagnosis not present

## 2024-07-08 DIAGNOSIS — I739 Peripheral vascular disease, unspecified: Secondary | ICD-10-CM | POA: Diagnosis not present

## 2024-07-20 DIAGNOSIS — E7800 Pure hypercholesterolemia, unspecified: Secondary | ICD-10-CM | POA: Diagnosis not present

## 2024-07-20 DIAGNOSIS — I739 Peripheral vascular disease, unspecified: Secondary | ICD-10-CM | POA: Diagnosis not present

## 2024-07-20 DIAGNOSIS — I10 Essential (primary) hypertension: Secondary | ICD-10-CM | POA: Diagnosis not present

## 2024-07-26 DIAGNOSIS — M961 Postlaminectomy syndrome, not elsewhere classified: Secondary | ICD-10-CM | POA: Diagnosis not present

## 2024-07-26 DIAGNOSIS — E7800 Pure hypercholesterolemia, unspecified: Secondary | ICD-10-CM | POA: Diagnosis not present

## 2024-07-26 DIAGNOSIS — R262 Difficulty in walking, not elsewhere classified: Secondary | ICD-10-CM | POA: Diagnosis not present

## 2024-07-26 DIAGNOSIS — Z6823 Body mass index (BMI) 23.0-23.9, adult: Secondary | ICD-10-CM | POA: Diagnosis not present

## 2024-07-26 DIAGNOSIS — Z125 Encounter for screening for malignant neoplasm of prostate: Secondary | ICD-10-CM | POA: Diagnosis not present

## 2024-07-26 DIAGNOSIS — Z0000 Encounter for general adult medical examination without abnormal findings: Secondary | ICD-10-CM | POA: Diagnosis not present

## 2024-07-26 DIAGNOSIS — I10 Essential (primary) hypertension: Secondary | ICD-10-CM | POA: Diagnosis not present

## 2024-07-26 DIAGNOSIS — Z23 Encounter for immunization: Secondary | ICD-10-CM | POA: Diagnosis not present

## 2024-08-07 DIAGNOSIS — I739 Peripheral vascular disease, unspecified: Secondary | ICD-10-CM | POA: Diagnosis not present

## 2024-08-07 DIAGNOSIS — I10 Essential (primary) hypertension: Secondary | ICD-10-CM | POA: Diagnosis not present

## 2024-08-19 DIAGNOSIS — I10 Essential (primary) hypertension: Secondary | ICD-10-CM | POA: Diagnosis not present

## 2024-08-19 DIAGNOSIS — I739 Peripheral vascular disease, unspecified: Secondary | ICD-10-CM | POA: Diagnosis not present

## 2024-08-19 DIAGNOSIS — E7800 Pure hypercholesterolemia, unspecified: Secondary | ICD-10-CM | POA: Diagnosis not present
